# Patient Record
Sex: Female | Born: 1955
Health system: Southern US, Community
[De-identification: ages and names within clinical notes are randomized; demographics above are authoritative.]

## PROBLEM LIST (undated history)

## (undated) DIAGNOSIS — F29 Unspecified psychosis not due to a substance or known physiological condition: Secondary | ICD-10-CM

## (undated) DIAGNOSIS — Z9889 Other specified postprocedural states: Secondary | ICD-10-CM

## (undated) DIAGNOSIS — I10 Essential (primary) hypertension: Secondary | ICD-10-CM

## (undated) DIAGNOSIS — E785 Hyperlipidemia, unspecified: Secondary | ICD-10-CM

## (undated) HISTORY — DX: Other specified postprocedural states: Z98.890

## (undated) HISTORY — DX: Hyperlipidemia, unspecified: E78.5

## (undated) HISTORY — DX: Essential (primary) hypertension: I10

## (undated) HISTORY — DX: Unspecified psychosis not due to a substance or known physiological condition: F29

---

## 1984-02-09 HISTORY — PX: ECTOPIC PREGNANCY SURGERY: SHX613

## 1999-02-09 HISTORY — PX: CHOLECYSTECTOMY: SHX55

## 2000-09-22 ENCOUNTER — Ambulatory Visit (HOSPITAL_COMMUNITY): Admission: RE | Admit: 2000-09-22 | Discharge: 2000-09-22 | Payer: Self-pay | Admitting: Family Medicine

## 2000-09-22 ENCOUNTER — Encounter: Payer: Self-pay | Admitting: Family Medicine

## 2000-09-26 ENCOUNTER — Encounter: Payer: Self-pay | Admitting: Family Medicine

## 2000-09-26 ENCOUNTER — Ambulatory Visit (HOSPITAL_COMMUNITY): Admission: RE | Admit: 2000-09-26 | Discharge: 2000-09-26 | Payer: Self-pay | Admitting: Family Medicine

## 2000-10-03 ENCOUNTER — Ambulatory Visit (HOSPITAL_COMMUNITY): Admission: RE | Admit: 2000-10-03 | Discharge: 2000-10-03 | Payer: Self-pay | Admitting: Family Medicine

## 2000-10-03 ENCOUNTER — Encounter: Payer: Self-pay | Admitting: Family Medicine

## 2000-10-12 ENCOUNTER — Ambulatory Visit (HOSPITAL_COMMUNITY): Admission: RE | Admit: 2000-10-12 | Discharge: 2000-10-12 | Payer: Self-pay | Admitting: General Surgery

## 2000-10-25 ENCOUNTER — Observation Stay (HOSPITAL_COMMUNITY): Admission: RE | Admit: 2000-10-25 | Discharge: 2000-10-26 | Payer: Self-pay | Admitting: General Surgery

## 2001-01-19 ENCOUNTER — Other Ambulatory Visit: Admission: RE | Admit: 2001-01-19 | Discharge: 2001-01-19 | Payer: Self-pay | Admitting: Family Medicine

## 2001-12-25 ENCOUNTER — Encounter: Payer: Self-pay | Admitting: Family Medicine

## 2001-12-25 ENCOUNTER — Ambulatory Visit (HOSPITAL_COMMUNITY): Admission: RE | Admit: 2001-12-25 | Discharge: 2001-12-25 | Payer: Self-pay | Admitting: Family Medicine

## 2004-01-27 ENCOUNTER — Ambulatory Visit: Payer: Self-pay | Admitting: Family Medicine

## 2004-05-28 ENCOUNTER — Ambulatory Visit: Payer: Self-pay | Admitting: Family Medicine

## 2004-06-01 ENCOUNTER — Ambulatory Visit (HOSPITAL_COMMUNITY): Admission: RE | Admit: 2004-06-01 | Discharge: 2004-06-01 | Payer: Self-pay | Admitting: Family Medicine

## 2004-09-14 ENCOUNTER — Ambulatory Visit (HOSPITAL_COMMUNITY): Admission: RE | Admit: 2004-09-14 | Discharge: 2004-09-14 | Payer: Self-pay | Admitting: Ophthalmology

## 2004-10-26 ENCOUNTER — Ambulatory Visit: Payer: Self-pay | Admitting: Family Medicine

## 2004-11-26 ENCOUNTER — Ambulatory Visit (HOSPITAL_COMMUNITY): Admission: RE | Admit: 2004-11-26 | Discharge: 2004-11-26 | Payer: Self-pay | Admitting: General Surgery

## 2005-03-01 ENCOUNTER — Ambulatory Visit: Payer: Self-pay | Admitting: Family Medicine

## 2005-08-04 ENCOUNTER — Ambulatory Visit: Payer: Self-pay | Admitting: Family Medicine

## 2005-08-09 ENCOUNTER — Ambulatory Visit (HOSPITAL_COMMUNITY): Admission: RE | Admit: 2005-08-09 | Discharge: 2005-08-09 | Payer: Self-pay | Admitting: Family Medicine

## 2005-08-17 ENCOUNTER — Ambulatory Visit: Payer: Self-pay | Admitting: Family Medicine

## 2005-09-09 ENCOUNTER — Ambulatory Visit (HOSPITAL_COMMUNITY): Payer: Self-pay | Admitting: Psychiatry

## 2006-01-18 ENCOUNTER — Ambulatory Visit (HOSPITAL_COMMUNITY): Admission: RE | Admit: 2006-01-18 | Discharge: 2006-01-18 | Payer: Self-pay | Admitting: Family Medicine

## 2006-05-06 ENCOUNTER — Ambulatory Visit: Payer: Self-pay | Admitting: Family Medicine

## 2006-10-19 ENCOUNTER — Emergency Department (HOSPITAL_COMMUNITY): Admission: EM | Admit: 2006-10-19 | Discharge: 2006-10-19 | Payer: Self-pay | Admitting: Emergency Medicine

## 2006-11-03 ENCOUNTER — Ambulatory Visit: Payer: Self-pay | Admitting: Family Medicine

## 2007-02-09 ENCOUNTER — Encounter: Payer: Self-pay | Admitting: Family Medicine

## 2007-05-30 ENCOUNTER — Ambulatory Visit: Payer: Self-pay | Admitting: Family Medicine

## 2007-08-31 ENCOUNTER — Ambulatory Visit: Payer: Self-pay | Admitting: Family Medicine

## 2007-09-05 DIAGNOSIS — I10 Essential (primary) hypertension: Secondary | ICD-10-CM

## 2007-09-09 DIAGNOSIS — F29 Unspecified psychosis not due to a substance or known physiological condition: Secondary | ICD-10-CM

## 2007-09-09 HISTORY — DX: Unspecified psychosis not due to a substance or known physiological condition: F29

## 2007-09-15 ENCOUNTER — Emergency Department (HOSPITAL_COMMUNITY): Admission: EM | Admit: 2007-09-15 | Discharge: 2007-09-15 | Payer: Self-pay | Admitting: Emergency Medicine

## 2007-09-16 ENCOUNTER — Inpatient Hospital Stay (HOSPITAL_COMMUNITY): Admission: AD | Admit: 2007-09-16 | Discharge: 2007-09-21 | Payer: Self-pay | Admitting: Psychiatry

## 2007-09-16 ENCOUNTER — Ambulatory Visit: Payer: Self-pay | Admitting: Psychiatry

## 2007-09-21 ENCOUNTER — Encounter: Payer: Self-pay | Admitting: Family Medicine

## 2007-09-25 ENCOUNTER — Ambulatory Visit: Payer: Self-pay | Admitting: Family Medicine

## 2007-09-25 DIAGNOSIS — F29 Unspecified psychosis not due to a substance or known physiological condition: Secondary | ICD-10-CM | POA: Insufficient documentation

## 2007-10-05 ENCOUNTER — Encounter: Payer: Self-pay | Admitting: Family Medicine

## 2007-12-26 ENCOUNTER — Other Ambulatory Visit: Admission: RE | Admit: 2007-12-26 | Discharge: 2007-12-26 | Payer: Self-pay | Admitting: Family Medicine

## 2007-12-26 ENCOUNTER — Encounter: Payer: Self-pay | Admitting: Family Medicine

## 2007-12-26 ENCOUNTER — Ambulatory Visit: Payer: Self-pay | Admitting: Family Medicine

## 2007-12-26 LAB — CONVERTED CEMR LAB: OCCULT 1: NEGATIVE

## 2007-12-27 ENCOUNTER — Telehealth: Payer: Self-pay | Admitting: Family Medicine

## 2008-02-07 ENCOUNTER — Ambulatory Visit: Payer: Self-pay | Admitting: Family Medicine

## 2008-02-12 ENCOUNTER — Encounter: Payer: Self-pay | Admitting: Family Medicine

## 2008-03-20 ENCOUNTER — Ambulatory Visit (HOSPITAL_COMMUNITY): Admission: RE | Admit: 2008-03-20 | Discharge: 2008-03-20 | Payer: Self-pay | Admitting: Gastroenterology

## 2008-03-20 ENCOUNTER — Ambulatory Visit: Payer: Self-pay | Admitting: Gastroenterology

## 2008-03-20 DIAGNOSIS — Z9889 Other specified postprocedural states: Secondary | ICD-10-CM

## 2008-03-20 HISTORY — DX: Other specified postprocedural states: Z98.890

## 2008-05-01 ENCOUNTER — Ambulatory Visit: Payer: Self-pay | Admitting: Family Medicine

## 2008-10-23 ENCOUNTER — Telehealth: Payer: Self-pay | Admitting: Family Medicine

## 2008-11-04 ENCOUNTER — Encounter: Payer: Self-pay | Admitting: Family Medicine

## 2009-01-29 ENCOUNTER — Telehealth: Payer: Self-pay | Admitting: Family Medicine

## 2009-02-14 ENCOUNTER — Encounter (INDEPENDENT_AMBULATORY_CARE_PROVIDER_SITE_OTHER): Payer: Self-pay | Admitting: *Deleted

## 2009-02-20 ENCOUNTER — Telehealth: Payer: Self-pay | Admitting: Family Medicine

## 2009-02-25 ENCOUNTER — Ambulatory Visit: Payer: Self-pay | Admitting: Family Medicine

## 2009-02-25 DIAGNOSIS — E559 Vitamin D deficiency, unspecified: Secondary | ICD-10-CM

## 2009-02-25 DIAGNOSIS — R5381 Other malaise: Secondary | ICD-10-CM

## 2009-02-25 DIAGNOSIS — R5383 Other fatigue: Secondary | ICD-10-CM

## 2009-02-25 DIAGNOSIS — R634 Abnormal weight loss: Secondary | ICD-10-CM

## 2009-02-27 LAB — CONVERTED CEMR LAB
BUN: 13 mg/dL (ref 6–23)
Basophils Relative: 0 % (ref 0–1)
CO2: 25 meq/L (ref 19–32)
Chloride: 105 meq/L (ref 96–112)
Cholesterol: 165 mg/dL (ref 0–200)
Creatinine, Ser: 0.75 mg/dL (ref 0.40–1.20)
Glucose, Bld: 78 mg/dL (ref 70–99)
HDL: 55 mg/dL (ref >39)
Hemoglobin: 11.8 g/dL — ABNORMAL LOW (ref 12.0–15.0)
Lymphocytes Relative: 23 % (ref 12–46)
MCHC: 32.4 g/dL (ref 30.0–36.0)
MCV: 86.3 fL (ref 78.0–100.0)
Neutrophils Relative %: 61 % (ref 43–77)
Platelets: 217 10*3/uL (ref 150–400)
RDW: 15 % (ref 11.5–15.5)
Sodium: 141 meq/L (ref 135–145)
Total CHOL/HDL Ratio: 3
VLDL: 8 mg/dL (ref 0–40)

## 2009-03-03 ENCOUNTER — Encounter: Payer: Self-pay | Admitting: Family Medicine

## 2009-03-03 ENCOUNTER — Ambulatory Visit (HOSPITAL_COMMUNITY): Admission: RE | Admit: 2009-03-03 | Discharge: 2009-03-03 | Payer: Self-pay | Admitting: Family Medicine

## 2009-05-12 ENCOUNTER — Ambulatory Visit: Payer: Self-pay | Admitting: Family Medicine

## 2009-05-12 ENCOUNTER — Other Ambulatory Visit: Admission: RE | Admit: 2009-05-12 | Discharge: 2009-05-12 | Payer: Self-pay | Admitting: Family Medicine

## 2009-05-12 LAB — CONVERTED CEMR LAB: OCCULT 1: NEGATIVE

## 2009-11-16 ENCOUNTER — Emergency Department (HOSPITAL_COMMUNITY)
Admission: EM | Admit: 2009-11-16 | Discharge: 2009-11-16 | Payer: Self-pay | Source: Home / Self Care | Admitting: Emergency Medicine

## 2009-11-19 ENCOUNTER — Ambulatory Visit: Payer: Self-pay | Admitting: Family Medicine

## 2010-02-06 ENCOUNTER — Telehealth (INDEPENDENT_AMBULATORY_CARE_PROVIDER_SITE_OTHER): Payer: Self-pay | Admitting: *Deleted

## 2010-03-01 ENCOUNTER — Encounter: Payer: Self-pay | Admitting: Family Medicine

## 2010-03-10 NOTE — Letter (Signed)
Summary: office  notes  office  notes   Imported By: Lind Guest 06/20/2009 13:05:30  _____________________________________________________________________  External Attachment:    Type:   Image     Comment:   External Document

## 2010-03-10 NOTE — Progress Notes (Signed)
Summary: MEDS  Phone Note Call from Patient   Summary of Call: NEEDS THESE MEDS CALLED IN AT [facility] IN [place]  KLOR-CON M 10 TAB APA    TRIAMT/HCTZ 75-50 MG  HAS APPT. TUESDAY AT 8:00 AM  PLEASE CALL HER WHEN DONE  [phone] Initial call taken by: Lind Guest,  February 20, 2009 10:58 AM  Follow-up for Phone Call        Patient hasn't been here since March 2010. Gave her a 1 month fill in december then she missed her January app. Called and wants another fill because she rescheduled for he 18th. Called pharmacy and she has maxzide there ready to be picked up and she got her last Klor con filled the first of January so she has enough to last to her next ov. called pt, left msg Follow-up by: Everitt Amber,  February 20, 2009 1:01 PM  Additional Follow-up for Phone Call Additional follow up Details #1::        pls let her know sufficient meds at pharmacy ready for collection when she callss back, or pls call and let her know Additional Follow-up by: Syliva Overman MD,  February 20, 2009 10:22 PM    Additional Follow-up for Phone Call Additional follow up Details #2::    i have called 4 x today and the machine will not accept messages Follow-up by: Lind Guest,  February 21, 2009 2:22 PM

## 2010-03-10 NOTE — Assessment & Plan Note (Signed)
Summary: OV   Vital Signs:  Patient profile:   55 year old female Menstrual status:  perimenopausal Height:      67 inches Weight:      123.25 pounds BMI:     19.37 O2 Sat:      98 % on Room air Pulse rate:   77 / minute Pulse rhythm:   regular Resp:     16 per minute BP sitting:   110 / 70  Vitals Entered By: Everitt Amber (February 25, 2009 8:13 AM)  O2 Flow:  Room air CC: Follow up chronic problems Is Patient Diabetic? No   Primary Care Provider:  Syliva Overman MD  CC:  Follow up chronic problems.  History of Present Illness: Reports  that she has been  doing well. Denies recent fever or chills. Denies sinus pressure, nasal congestion , ear pain or sore throat. Denies chest congestion, or cough productive of sputum. Denies chest pain, palpitations, PND, orthopnea or leg swelling. Denies abdominal pain, nausea, vomitting, diarrhea or constipation. Denies change in bowel movements or bloody stool. Denies dysuria , frequency, incontinence or hesitancy. Denies  joint pain, swelling, or reduced mobility. Denies headaches, vertigo, seizures. Denies depression, anxiety or insomnia.She does state thAT SHE OFTEN FEELS LONELY. sHE MISSES HER DAUGHTER AND HUSBAND AND IS NOT DATING. sHE FINDS IT CHALLENGING TO EAT AT TIMES, SINCE SHE IS ALONE AND SHE HAS LOST AN ADDITIONAL APPROX 10 POUNDS SINCE HER LAST VISIT. Denies  rash, lesions, or itch.     Preventive Screening-Counseling & Management  Alcohol-Tobacco     Smoking Status: no  Allergies: 1)  ! Prednisone 2)  ! * Ciprofloxacin  Social History: Smoking Status:  no  Review of Systems      See HPI Eyes:  Denies blurring and discharge. GU:  reports menstrual flow approx 3 months ago, and denies menopause. Neuro:  Denies falling down, headaches, seizures, and tingling. Psych:  Complains of mental problems; denies anxiety, depression, easily angered, easily tearful, irritability, panic attacks, sense of great danger,  suicidal thoughts/plans, thoughts of violence, and unusual visions or sounds. Endo:  Denies cold intolerance, excessive hunger, excessive thirst, excessive urination, heat intolerance, polyuria, and weight change. Heme:  Denies abnormal bruising and bleeding. Allergy:  Denies hives or rash, itching eyes, and seasonal allergies.  Physical Exam  General:  Well-developed,enderweight appearing,in no acute distress; alert,unable to give a reliable history, confused HEENT: No facial asymmetry,  EOMI, No sinus tenderness, TM's Clear, oropharynx  pink and moist.   Chest: Clear to auscultation bilaterally.  CVS: S1, S2, No murmurs, No S3.   Abd: Soft, Nontender.  MS: Adequate ROM spine, hips, shoulders and knees.  Ext: No edema.   CNS: CN 2-12 intact, power tone and sensation normal throughout.   Skin: Intact, no visible lesions or rashes.  Psych: Good eye contact,flat affect.  Memory losst, not anxious or depressed appearing.    Impression & Recommendations:  Problem # 1:  VITAMIN D DEFICIENCY (ICD-268.9) Assessment Comment Only  Orders: T-Vitamin D (25-Hydroxy) ([id number])  Problem # 2:  LOSS OF WEIGHT (ICD-783.21) Assessment: Deteriorated encouraged to schedule regular meals and supplement with ensure  Problem # 3:  HYPERTENSION (ICD-401.9) Assessment: Improved  The following medications were removed from the medication list:    Triamterene-hctz 75-50 Mg Tabs (Triamterene-hctz) ..... One tab by mouth qd  Orders: T-Basic Metabolic Panel [id number])  BP today: 110/70, pt off meds for over 1 week reportedly Prior BP: 108/70 (05/01/2008)  Labs Reviewed:  K+: 3.7 (09/25/2007)  Complete Medication List: 1)  Centrum Liqd (Multiple vitamins-minerals) .... One teaspoon daily 2)  Oscal 500/200 D-3 500-200 Mg-unit Tabs (Calcium-vitamin d) .... Take 1 tablet by mouth three times a day  Other Orders: T-Lipid Profile ([id number]) T-TSH ([id number]) T-CBC w/Diff  [phone]) Radiology Referral (Radiology) Radiology Referral (Radiology)  Patient Instructions: 1)  CPE in 4 weeks. 2)  BMP prior to visit, ICD-9: 3)  Lipid Panel prior to visit, ICD-9:  fasting today 4)  TSH prior to visit, ICD-9: 5)  CBC w/ Diff prior to visit, ICD-9: 6)  Vit D level 7)  NO more blood pressure pills needed, Bp is normal . 8)  You will be referred for a mamo and a bone density scan Prescriptions: OSCAL 500/200 D-3 500-200 MG-UNIT TABS (CALCIUM-VITAMIN D) Take 1 tablet by mouth three times a day  #90 x 11   Entered and Authorized by:   Syliva Overman MD   Signed by:   Syliva Overman MD on 02/25/2009   Method used:   Electronically to        [facility] [address] [facility] (retail)       [address]       [place], [place]  [address]       Ph: [phone]       Fax: [phone]   RxID:   [id number] CENTRUM  LIQD (MULTIPLE VITAMINS-MINERALS) one teaspoon daily  #150cc x 2   Entered and Authorized by:   Syliva Overman MD   Signed by:   Syliva Overman MD on 02/25/2009   Method used:   Electronically to        [facility] [address] [facility] (retail)       [address]       [place], [place]  [address]       Ph: [phone]       Fax: [phone]   RxID:   [id number]       Appended Document: OV Pls refill pts maxzide , at the ov I believed what she told me that she had not taken them for over 1 week,she is an unreliable historian, and I think based on refill noted she has still been taking them, verify [facility] if they have been collected in past 4 months, and let pt know she needs torsume the maxzide pls   IF yOU are CONFUSED pls discuss with me before calling the pt  Appended Document: OV called patient, left message  Appended Document: OV called patient, no answer, mailed letter  Appended Document: OV Prescriptions: VITAMIN D (ERGOCALCIFEROL) 50000 UNIT CAPS (ERGOCALCIFEROL) one cap by mouth every  week  #4 x 2   Entered by:   Worthy Keeler LPN   Authorized by:   Syliva Overman MD   Signed by:   Worthy Keeler LPN on 07/37/1062   Method used:   Electronically to        [facility] [address] [facility] (retail)       [address]       [place], [place]  [address]       Ph: [phone]       Fax: [phone]   RxID:   [id number] MAXZIDE 75-50 MG TABS (TRIAMTERENE-HCTZ) one tab by mouth qd  #30 x 2   Entered by:   Worthy Keeler LPN  Authorized by:   Syliva Overman MD   Signed by:   Worthy Keeler LPN on 16/11/9602   Method used:   Electronically to        [facility] [facility] (retail)       [address]       [place], [place]  [address]       Ph: [phone]       Fax: [phone]   RxID:   [id number]  rx sent, patient aware

## 2010-03-10 NOTE — Letter (Signed)
Summary: labs  labs   Imported By: Lind Guest 06/20/2009 13:04:43  _____________________________________________________________________  External Attachment:    Type:   Image     Comment:   External Document

## 2010-03-10 NOTE — Letter (Signed)
Summary: 1st Missed Appt.  [facility]  [address]   [place], [place] [address]   Phone: [phone]  Fax: [phone]    February 14, 2009  MRN: [id number]  TALAJAH SLIMP [address] [place], [place]  [address]  Dear Ms. Senaida Ores,  At [facility], we make every attempt to fit patients into our schedule by reserving several appointment slots for same-day appointments.  However, we cannot always make appointments for patients the same day they are calling.  At the end of the day, we look back at our schedule and find that because of last-minute cancellations and patients not showing up for their scheduled appointments, we have several appointment slots that are left open and could have been used by another person who really needed it.  In the past, you may have been one of the patients who could not get in when you needed to.  But recently, you were one of the patients with an appointment that you didn't show up for or canceled too late for [place] to fill it.  We choose not to charge no-show or last minute cancellation fees to our patients, like many other offices do.  We do not wish to institute that policy and hope we never have to.  However, we kindly request that you assist [place] by providing at least 24 hours' notice if you can't make your appointment.  If no-shows or late cancellations become habitual (i.e. Three or more in a one-year period), we may terminate the physician-patient relationship.    Thank you for your consideration and cooperation.   Altamease Oiler

## 2010-03-10 NOTE — Assessment & Plan Note (Signed)
Summary: ov   Vital Signs:  Patient profile:   55 year old female Menstrual status:  perimenopausal Height:      67 inches Weight:      122.75 pounds BMI:     19.29 O2 Sat:      94 % on Room air Pulse rate:   72 / minute Pulse rhythm:   regular Resp:     16 per minute BP sitting:   110 / 80  (right arm) Cuff size:   regular  Vitals Entered By: Mauricia Area, CMA  O2 Flow:  Room air CC: follow up   Primary Care Provider:  Syliva Overman MD  CC:  follow up.  History of Present Illness: Pt in for f/u of recent dog bite to the left  hand by her pet. She was initially treated in the ed. States the area is getting better. Initially it was painful, red and draining pus. She denies any current feer or chills and has no other concerns. Reports  that prior to this she had been doing well. Denies recent fever or chills. Denies sinus pressure, nasal congestion , ear pain or sore throat. Denies chest congestion, or cough productive of sputum. Denies chest pain, palpitations, PND, orthopnea or leg swelling. Denies abdominal pain, nausea, vomitting, diarrhea or constipation. Denies change in bowel movements or bloody stool. Denies dysuria , frequency, incontinence or hesitancy. Denies  joint pain, swelling, or reduced mobility. Denies headaches, vertigo, seizures.   Allergies (verified): 1)  ! Prednisone 2)  ! * Ciprofloxacin  Review of Systems      See HPI Eyes:  Denies discharge and red eye. Psych:  Complains of mental problems; denies suicidal thoughts/plans, thoughts of violence, and unusual visions or sounds. Endo:  Denies excessive thirst and excessive urination. Heme:  Denies abnormal bruising and bleeding.  Physical Exam  General:  Well-developed,well-nourished,in no acute distress; alert,appropriate and cooperative throughout examination HEENT: No facial asymmetry,  EOMI, No sinus tenderness, TM's Clear, oropharynx  pink and moist.   Chest: Clear to auscultation  bilaterally.  CVS: S1, S2, No murmurs, No S3.   Abd: Soft, Nontender.  MS: Adequate ROM spine, hips, shoulders and knees.  Ext: No edema.   CNS: CN 2-12 intact, power tone and sensation normal throughout.   Skin: Intact, no evidence of cellulitis of left hand, healing puncture site Psych: Good eye contact, blunted affect.     Impression & Recommendations:  Problem # 1:  DOG BITE (ICD-E906.0) Assessment Improved pt advised to keep are clean and dry, and to complete antibiotic course  Problem # 2:  HYPERTENSION (ICD-401.9) Assessment: Unchanged  Her updated medication list for this problem includes:    Maxzide 75-50 Mg Tabs (Triamterene-hctz) ..... One tab by mouth qd  Orders: T-Basic Metabolic Panel [id number])  BP today: 110/80 Prior BP: 120/70 (05/12/2009)  Labs Reviewed: K+: 3.7 (02/25/2009) Creat: : 0.75 (02/25/2009)   Chol: 165 (02/25/2009)   HDL: 55 (02/25/2009)   LDL: 102 (02/25/2009)   TG: 38 (02/25/2009)  Problem # 3:  UNSPECIFIED PSYCHOSIS (ICD-298.9) Assessment: Improved pt on no meds, however came out of work, disabled because of uncontrolled psychotic symptoms  Complete Medication List: 1)  Centrum Liqd (Multiple vitamins-minerals) .... One teaspoon daily 2)  Oscal 500/200 D-3 500-200 Mg-unit Tabs (Calcium-vitamin d) .... Take 1 tablet by mouth three times a day 3)  Maxzide 75-50 Mg Tabs (Triamterene-hctz) .... One tab by mouth qd 4)  Vitamin D (ergocalciferol) 50000 Unit Caps (Ergocalciferol) .... One  cap by mouth every week 5)  Klor-con 10 10 Meq Cr-tabs (Potassium chloride) .... One tab by mouth once daily 6)  Amoxicillin-pot Clavulanate 875-125 Mg Tabs (Amoxicillin-pot clavulanate) .Marland Kitchen.. 1 tab two times a day  Other Orders: T-Lipid Profile ([id number]) T-CBC w/Diff [id number]) T-Vitamin D (25-Hydroxy) [phone]) Influenza Vaccine MCR ([address]52)  Patient Instructions: 1)  Please schedule a follow-up appointment in 4 months. 2)  flu vaccine  today. 3)  pls finish the antibiotics, and try to get no more bites!!! 4)  BMP prior to visit, ICD-9: 5)  Lipid Panel prior to visit, ICD-9:   fasting labs in 4 mths 6)  CBC w/ Diff prior to visit, ICD-9: 7)  No med changes Prescriptions: KLOR-CON 10 10 MEQ CR-TABS (POTASSIUM CHLORIDE) one tab by mouth once daily  #30 x 4   Entered by:   Adella Hare LPN   Authorized by:   Syliva Overman MD   Signed by:   Adella Hare LPN on 84/13/2440   Method used:   Electronically to        [facility]  [address] (retail)       [address]       [place], [place]  [address]       Ph: [phone]       Fax: [phone]   RxID:   [id number] MAXZIDE 75-50 MG TABS (TRIAMTERENE-HCTZ) one tab by mouth qd  #30 x 4   Entered by:   Adella Hare LPN   Authorized by:   Syliva Overman MD   Signed by:   Adella Hare LPN on 30/16/0109   Method used:   Electronically to        [facility]  [address] (retail)       [address]       [place], [place]  [address]       Ph: [phone]       Fax: [phone]   RxID:   [id number]    Immunizations Administered:  Influenza Vaccine # 1:    Vaccine Type: Fluvax MCR    Site: right deltoid    Mfr: novartis    Dose: 0.5 ml    Route: IM    Given by: Adella Hare LPN    Exp. Date: 06/2010    Lot #: [id number]    VIS given: 09/02/09 version given November 19, 2009.

## 2010-03-10 NOTE — Letter (Signed)
Summary: x rays  x rays   Imported By: Lind Guest 06/20/2009 13:06:03  _____________________________________________________________________  External Attachment:    Type:   Image     Comment:   External Document

## 2010-03-10 NOTE — Letter (Signed)
Summary: MED REVIEW SIGNED PAPER  MED REVIEW SIGNED PAPER   Imported By: Lind Guest 11/21/2009 09:54:27  _____________________________________________________________________  External Attachment:    Type:   Image     Comment:   External Document

## 2010-03-10 NOTE — Letter (Signed)
Summary: phone notes  phone notes   Imported By: Lind Guest 06/20/2009 13:06:39  _____________________________________________________________________  External Attachment:    Type:   Image     Comment:   External Document

## 2010-03-10 NOTE — Letter (Signed)
Summary: consults  consults   Imported By: Lind Guest 06/20/2009 13:04:04  _____________________________________________________________________  External Attachment:    Type:   Image     Comment:   External Document

## 2010-03-10 NOTE — Assessment & Plan Note (Signed)
Summary: physical per Dr. Lodema Hong in 4wks   Vital Signs:  Patient profile:   55 year old female Menstrual status:  perimenopausal Height:      67 inches Weight:      118 pounds BMI:     18.55 O2 Sat:      98 % Pulse rate:   60 / minute Pulse rhythm:   regular Resp:     16 per minute BP sitting:   120 / 70  (left arm) Cuff size:   regular  Vitals Entered By: Everitt Amber LPN (May 13, 5407 2:52 PM)  CC: CPE   Primary Care Provider:  Syliva Overman MD  CC:  CPE.  History of Present Illness: Reports  thatshe is doing fairly well, and has no complaints. unfortunately, the pt continues to lose weight, and reports thar she "eats well" Denies recent fever or chills. Denies sinus pressure, nasal congestion , ear pain or sore throat. Denies chest congestion, or cough productive of sputum. Denies chest pain, palpitations, PND, orthopnea or leg swelling. Denies abdominal pain, nausea, vomitting, diarrhea or constipation. Denies change in bowel movements or bloody stool. Denies dysuria , frequency, incontinence or hesitancy. Denies  joint pain, swelling, or reduced mobility. Denies headaches, vertigo, seizures. Denies depression, anxiety or insomnia.Denies hallucinATIONS Denies  rash, lesions, or itch.     Current Medications (verified): 1)  Centrum  Liqd (Multiple Vitamins-Minerals) .... One Teaspoon Daily 2)  Oscal 500/200 D-3 500-200 Mg-Unit Tabs (Calcium-Vitamin D) .... Take 1 Tablet By Mouth Three Times A Day 3)  Maxzide 75-50 Mg Tabs (Triamterene-Hctz) .... One Tab By Mouth Qd 4)  Vitamin D (Ergocalciferol) 50000 Unit Caps (Ergocalciferol) .... One Cap By Mouth Every Week 5)  Klor-Con 10 10 Meq Cr-Tabs (Potassium Chloride) .... One Tab By Mouth Once Daily  Allergies (verified): 1)  ! Prednisone 2)  ! * Ciprofloxacin  Review of Systems      See HPI General:  Denies fatigue and sleep disorder. Eyes:  Denies blurring and discharge. Endo:  Denies cold intolerance,  excessive hunger, excessive thirst, excessive urination, heat intolerance, and polyuria. Heme:  Denies abnormal bruising, bleeding, enlarge lymph nodes, and fevers. Allergy:  Complains of seasonal allergies; denies hives or rash and itching eyes; MILD.  Physical Exam  General:  alert, well-hydrated, and underweight appearing.   Head:  Normocephalic and atraumatic without obvious abnormalities. No apparent alopecia or balding. Eyes:  No corneal or conjunctival inflammation noted. EOMI. Perrla. Funduscopic exam benign, without hemorrhages, exudates or papilledema. Vision grossly normal. Ears:  External ear exam shows no significant lesions or deformities.  Otoscopic examination reveals clear canals, tympanic membranes are intact bilaterally without bulging, retraction, inflammation or discharge. Hearing is grossly normal bilaterally. Nose:  External nasal examination shows no deformity or inflammation. Nasal mucosa are pink and moist without lesions or exudates. Mouth:  pharynx pink and moist and fair dentition.   Neck:  No deformities, masses, or tenderness noted. Chest Wall:  No deformities, masses, or tenderness noted. Lungs:  Normal respiratory effort, chest expands symmetrically. Lungs are clear to auscultation, no crackles or wheezes. Heart:  Normal rate and regular rhythm. S1 and S2 normal without gallop, murmur, click, rub or other extra sounds. Abdomen:  Bowel sounds positive,abdomen soft and non-tender without masses, organomegaly or hernias noted. Rectal:  No external abnormalities noted. Normal sphincter tone. No rectal masses or tenderness.GUAIC NEGATIVE STOOL Genitalia:  Normal introitus for age, no external lesions, no vaginal discharge, mucosa pink and moist, no  vaginal or cervical lesions, no vaginal atrophy, no friaility or hemorrhage, normal uterus size and position, no adnexal masses or tenderness Msk:  No deformity or scoliosis noted of thoracic or lumbar spine.   Pulses:  R  and L carotid,radial,femoral,dorsalis pedis and posterior tibial pulses are full and equal bilaterally Extremities:  No clubbing, cyanosis, edema, or deformity noted with normal full range of motion of all joints.   Neurologic:  No cranial nerve deficits noted. Station and gait are normal. Plantar reflexes are down-going bilaterally. DTRs are symmetrical throughout. Sensory, motor and coordinative functions appear intact. Skin:  Intact without suspicious lesions or rashes Cervical Nodes:  No lymphadenopathy noted Axillary Nodes:  No palpable lymphadenopathy Inguinal Nodes:  No significant adenopathy Psych:  Oriented X3, good eye contact, depressed affect, and subdued.     Impression & Recommendations:  Problem # 1:  LOSS OF WEIGHT (ICD-783.21) Assessment Deteriorated  PT ADVISED TO MAKE A CONCIOUS EFFORT TO INC WEIGHT EG WITH THE ADDITION OF NUTRTIONSAL SUPPLEMENTS, IF NOT SUCCESSFUL CONSIDERATION WILL BE GIVEN TO AN APETITIE STIMULANT  Problem # 2:  UNSPECIFIED PSYCHOSIS (ICD-298.9) Assessment: Improved Pt showing no obvious signs of psychosissince being layed off, though her affect remains abnormal   Problem # 3:  HYPERTENSION (ICD-401.9) Assessment: Unchanged  Her updated medication list for this problem includes:    Maxzide 75-50 Mg Tabs (Triamterene-hctz) ..... One tab by mouth qd  BP today: 120/70 Prior BP: 110/70 (02/25/2009)  Labs Reviewed: K+: 3.7 (02/25/2009) Creat: : 0.75 (02/25/2009)   Chol: 165 (02/25/2009)   HDL: 55 (02/25/2009)   LDL: 102 (02/25/2009)   TG: 38 (02/25/2009)  Complete Medication List: 1)  Centrum Liqd (Multiple vitamins-minerals) .... One teaspoon daily 2)  Oscal 500/200 D-3 500-200 Mg-unit Tabs (Calcium-vitamin d) .... Take 1 tablet by mouth three times a day 3)  Maxzide 75-50 Mg Tabs (Triamterene-hctz) .... One tab by mouth qd 4)  Vitamin D (ergocalciferol) 50000 Unit Caps (Ergocalciferol) .... One cap by mouth every week 5)  Klor-con 10 10 Meq  Cr-tabs (Potassium chloride) .... One tab by mouth once daily  Other Orders: Pelvic & Breast Exam ( Medicare)  (G0101) Hemoccult Guaiac-1 spec.(in office) ([address])  Patient Instructions: 1)  Please schedule a follow-up appointment in 3 months. 2)  PLS MAKE EVERY ATTEMPT TO INCREASE YOUR INTAKE, DRINK  ENSUREOR BOOST AS SUPPLEMENTS. YOU HAVE LOST AN ADDITIONAL 5 POUNDS AND YOU ARE BECOMING UNDERWEIGHT. iF YOU LOSE MORE WEIGHT YOU WILL NEED TO START MED TO GAIN WEIGHT Prescriptions: KLOR-CON 10 10 MEQ CR-TABS (POTASSIUM CHLORIDE) one tab by mouth once daily  #30 x 3   Entered by:   Everitt Amber LPN   Authorized by:   Syliva Overman MD   Signed by:   Everitt Amber LPN on 60/45/4098   Method used:   Electronically to        [facility]  [address] (retail)       [address]       [place], [place]  [address]       Ph: [phone]       Fax: [phone]   RxID:   [id number] VITAMIN D (ERGOCALCIFEROL) 50000 UNIT CAPS (ERGOCALCIFEROL) one cap by mouth every week  #4 x 3   Entered by:   Everitt Amber LPN   Authorized by:   Syliva Overman MD   Signed by:   Everitt Amber LPN on 53/66/4403   Method used:  Electronically to        [facility] [address] (retail)       [address]       [place], [place]  [address]       Ph: [phone]       Fax: [phone]   RxID:   [id number]   Laboratory Results  Date/Time Received: May 12, 2009  Date/Time Reported: May 12, 2009   Stool - Occult Blood Hemmoccult #1: negative Date: 05/12/2009 Comments: [id number] r 8/11 118 10 12

## 2010-03-10 NOTE — Letter (Signed)
Summary: demo  demo   Imported By: Lind Guest 06/20/2009 13:03:34  _____________________________________________________________________  External Attachment:    Type:   Image     Comment:   External Document

## 2010-03-10 NOTE — Letter (Signed)
Summary: history and phy  history and phy   Imported By: Lind Guest 06/20/2009 13:03:01  _____________________________________________________________________  External Attachment:    Type:   Image     Comment:   External Document

## 2010-03-10 NOTE — Letter (Signed)
Summary: mis  mis   Imported By: Lind Guest 06/20/2009 10:40:03  _____________________________________________________________________  External Attachment:    Type:   Image     Comment:   External Document

## 2010-03-12 NOTE — Progress Notes (Signed)
Summary: please advise  Phone Note Call from Patient   Summary of Call: patients daughter called in and states that her mother's life insurance company has a wavier to them and mailed in for her mom, I told her I thought it was a $29.00 fee for that but I wasn't sure can you please call her at [phone] Alvira Philips is the daughters name.  She wants to get the paperwork in the mail to [place] but she is waiting on you to let her know if it is a charge.  thanks Initial call taken by: Curtis Sites,  February 06, 2010 1:27 PM  Follow-up for Phone Call        spoke with daughter knows that there is a 15.00 charge for a paper to be filled out Follow-up by: Lind Guest,  February 11, 2010 3:23 PM

## 2010-03-23 ENCOUNTER — Ambulatory Visit: Payer: Self-pay | Admitting: Family Medicine

## 2010-03-27 ENCOUNTER — Telehealth: Payer: Self-pay | Admitting: Family Medicine

## 2010-03-31 ENCOUNTER — Encounter: Payer: Self-pay | Admitting: Family Medicine

## 2010-04-02 ENCOUNTER — Encounter: Payer: Self-pay | Admitting: Family Medicine

## 2010-04-07 NOTE — Progress Notes (Signed)
  Phone Note Call from Patient   Summary of Call: Gina Black from [place] called regarding some forms that were supposed to be filled out regarding disability. I told her that I didn't know anything about that and that I would pass the message along to the doctor on Monday Her [phone] Initial call taken by: Everitt Amber LPN,  March 27, 2010 3:23 PM  Follow-up for Phone Call        forms completed, pls scan into special folder also keep paepr copy as difficult to read Follow-up by: Syliva Overman MD,  March 31, 2010 5:33 PM  Additional Follow-up for Phone Call Additional follow up Details #1::        called daughter at [phone] ext [phone] and I told her that I would mail the letters. Additional Follow-up by: Lind Guest,  April 01, 2010 8:27 AM

## 2010-04-07 NOTE — Letter (Signed)
Summary: [employer] [facility] [employer] [facility] [employer]   Imported By: Lind Guest 04/01/2010 08:47:14  _____________________________________________________________________  External Attachment:    Type:   Image     Comment:   External Document

## 2010-04-07 NOTE — Letter (Signed)
Summary: statement of disability papers filled out  statement of disability papers filled out   Imported By: Lind Guest 04/02/2010 10:51:08  _____________________________________________________________________  External Attachment:    Type:   Image     Comment:   External Document

## 2010-04-15 ENCOUNTER — Ambulatory Visit (INDEPENDENT_AMBULATORY_CARE_PROVIDER_SITE_OTHER): Payer: Medicaid Other | Admitting: Family Medicine

## 2010-04-15 ENCOUNTER — Encounter: Payer: Self-pay | Admitting: Family Medicine

## 2010-04-15 ENCOUNTER — Other Ambulatory Visit: Payer: Self-pay | Admitting: Family Medicine

## 2010-04-15 DIAGNOSIS — I10 Essential (primary) hypertension: Secondary | ICD-10-CM

## 2010-04-15 DIAGNOSIS — R634 Abnormal weight loss: Secondary | ICD-10-CM

## 2010-04-15 DIAGNOSIS — F29 Unspecified psychosis not due to a substance or known physiological condition: Secondary | ICD-10-CM

## 2010-04-15 LAB — CONVERTED CEMR LAB
Basophils Absolute: 0 10*3/uL (ref 0.0–0.1)
Basophils Relative: 1 % (ref 0–1)
CO2: 25 meq/L (ref 19–32)
Chloride: 100 meq/L (ref 96–112)
Cholesterol: 186 mg/dL (ref 0–200)
Eosinophils Absolute: 0.2 10*3/uL (ref 0.0–0.7)
Glucose, Bld: 74 mg/dL (ref 70–99)
HCT: 39 % (ref 36.0–46.0)
MCHC: 32.8 g/dL (ref 30.0–36.0)
Monocytes Absolute: 0.2 10*3/uL (ref 0.1–1.0)
Neutro Abs: 2.2 10*3/uL (ref 1.7–7.7)
RBC: 4.74 M/uL (ref 3.87–5.11)
Sodium: 140 meq/L (ref 135–145)
TSH: 1.54 microintl units/mL (ref 0.350–4.500)
Total CHOL/HDL Ratio: 2.8
Triglycerides: 45 mg/dL (ref <150)
VLDL: 9 mg/dL (ref 0–40)
Vit D, 25-Hydroxy: 46 ng/mL (ref 30–89)
WBC: 4.5 10*3/uL (ref 4.0–10.5)

## 2010-04-16 LAB — CBC WITH DIFFERENTIAL/PLATELET
Basophils Absolute: 0 10*3/uL (ref 0.0–0.1)
Eosinophils Absolute: 0.2 10*3/uL (ref 0.0–0.7)
Eosinophils Relative: 4 % (ref 0–5)
HCT: 39 % (ref 36.0–46.0)
MCHC: 32.8 g/dL (ref 30.0–36.0)
Monocytes Absolute: 0.2 10*3/uL (ref 0.1–1.0)
Monocytes Relative: 4 % (ref 3–12)
Neutro Abs: 2.2 10*3/uL (ref 1.7–7.7)
Neutrophils Relative %: 50 % (ref 43–77)
Platelets: 212 10*3/uL (ref 150–400)
RBC: 4.74 MIL/uL (ref 3.87–5.11)

## 2010-04-16 LAB — LIPID PANEL
LDL Cholesterol: 110 mg/dL — ABNORMAL HIGH (ref 0–99)
Triglycerides: 45 mg/dL (ref <150)

## 2010-04-16 LAB — BASIC METABOLIC PANEL
CO2: 25 mEq/L (ref 19–32)
Creat: 0.83 mg/dL (ref 0.40–1.20)

## 2010-04-16 LAB — TSH: TSH: 1.54 u[IU]/mL (ref 0.350–4.500)

## 2010-04-21 ENCOUNTER — Telehealth: Payer: Self-pay | Admitting: Family Medicine

## 2010-04-28 NOTE — Progress Notes (Signed)
Summary: speak to nurse  Phone Note Call from Patient   Summary of Call: pharm has faxed office. and pt is wanting nurse to fax it back to pharm. [phone] asap Initial call taken by: Rudene Anda,  April 21, 2010 3:22 PM    Prescriptions: ZYPREXA 2.5 MG TABS (OLANZAPINE) Take 1 tab by mouth at bedtime  #30 x 0   Entered by:   Adella Hare LPN   Authorized by:   Syliva Overman MD   Signed by:   Adella Hare LPN on 78/29/5621   Method used:   Electronically to        [facility] [phone]* (retail)       [address]       [place], [place]  [address]       Ph: [phone]       Fax: [phone]   RxID:   [id number]

## 2010-04-28 NOTE — Assessment & Plan Note (Signed)
Summary: follow up   Vital Signs:  Patient profile:   55 year old female Menstrual status:  perimenopausal Height:      67 inches Weight:      115 pounds BMI:     18.08 O2 Sat:      98 % Pulse rate:   87 / minute Pulse rhythm:   regular Resp:     16 per minute BP sitting:   104 / 72  (left arm) Cuff size:   regular  Vitals Entered By: Everitt Amber LPN (April 15, 5619 2:16 PM) CC: Follow up chronic problems, daughter has noticed some swelling in her ankles for about a month   Primary Care Provider:  Syliva Overman MD  CC:  Follow up chronic problems and daughter has noticed some swelling in her ankles for about a month.  History of Present Illness: Pt is in with her daughter today. ms. [place] states she is well and has no problems. she unfortunately continues to lose weight , and eats very little even though her daughte is mking every effort to assist her with this. the pt has mental illness, refses treatment, is not a current threat to herself or anyone else, and she lives alone. her only interest is in living with her only daughter who categorically states this is not possible due hermother's health and the fact that she herself needs a life.  Denies recent fever or chills. Denies sinus pressure, nasal congestion , ear pain or sore throat. Denies chest congestion, or cough productive of sputum. Denies chest pain, palpitations, PND, orthopnea or leg swelling. Denies abdominal pain, nausea, vomitting, diarrhea or constipation. Denies change in bowel movements or bloody stool. Denies dysuria , frequency, incontinence or hesitancy. Denies  joint pain, swelling, or reduced mobility. Denies headaches, vertigo, seizures. Denies depression, anxiety or insomnia. Denies  rash, lesions, or itch.     Current Medications (verified): 1)  Oscal 500/200 D-3 500-200 Mg-Unit Tabs (Calcium-Vitamin D) .... Take 1 Tablet By Mouth Three Times A Day 2)  Maxzide 75-50 Mg Tabs  (Triamterene-Hctz) .... One Tab By Mouth Qd 3)  Vitamin D (Ergocalciferol) 50000 Unit Caps (Ergocalciferol) .... One Cap By Mouth Every Week 4)  Klor-Con 10 10 Meq Cr-Tabs (Potassium Chloride) .... One Tab By Mouth Once Daily  Allergies (verified): 1)  ! Prednisone 2)  ! * Ciprofloxacin  Review of Systems      See HPI General:  Complains of fatigue, loss of appetite, and weight loss. Eyes:  Denies discharge, eye pain, and red eye. Psych:  Complains of mental problems; denies anxiety, depression, suicidal thoughts/plans, and thoughts of violence. Endo:  Denies cold intolerance, excessive hunger, excessive thirst, and excessive urination. Heme:  Denies abnormal bruising and bleeding. Allergy:  Denies hives or rash and itching eyes.  Physical Exam  General:  undernourished chronicaslly ill appearing, flat affect, female , in no c/p distress. HEENT: No facial asymmetry,  EOMI, No sinus tenderness,, oropharynx  pink and moist. Bitemporal wasting  Chest: Clear to auscultation bilaterally.  CVS: S1, S2, No murmurs, No S3.   Abd: Soft, Nontender.  MS: Adequate ROM spine, hips, shoulders and knees.  Ext: No edema.   CNS: CN 2-12 intact, power tone and sensation normal throughout.   Skin: Intact, no evidence of cellulitis of left hand, healing puncture site Psych: Good eye contact, blunted affect.  Pt has no insight into the fact that she is physically deteriorating due to inadequate food intake and she is refusing  placement at this time , competence issues may need to be addressed   Impression & Recommendations:  Problem # 1:  LOSS OF WEIGHT (ICD-783.21) Assessment Deteriorated pt challenged to gain weight prior to her return , if not adult protective services will be called in, her daughter is in full agreement and is extremely concerned. States mothe's living conditions are unsatisfactory, has a dog andis incapable of caring for it, will consider sooner intervention.labs are being  checked  Problem # 2:  HYPERTENSION (ICD-401.9) Assessment: Unchanged  Her updated medication list for this problem includes:    Maxzide 75-50 Mg Tabs (Triamterene-hctz) ..... One tab by mouth qd  Orders: T-Basic Metabolic Panel [id number])  BP today: 104/72 Prior BP: 110/80 (11/19/2009)  Labs Reviewed: K+: 3.7 (02/25/2009) Creat: : 0.75 (02/25/2009)   Chol: 165 (02/25/2009)   HDL: 55 (02/25/2009)   LDL: 102 (02/25/2009)   TG: 38 (02/25/2009)  Problem # 3:  UNSPECIFIED PSYCHOSIS (ICD-298.9) Assessment: Comment Only pt has a history of involuntary commitment by her daughter several years ago, the exact dx and med she was on is unknown at this time, she has not consistetly taken any psych meds over the  past 10 years  she is not a threat to herself or society at this time zyprexa is prescribed  Complete Medication List: 1)  Oscal 500/200 D-3 500-200 Mg-unit Tabs (Calcium-vitamin d) .... Take 1 tablet by mouth three times a day 2)  Maxzide 75-50 Mg Tabs (Triamterene-hctz) .... One tab by mouth qd 3)  Vitamin D (ergocalciferol) 50000 Unit Caps (Ergocalciferol) .... One cap by mouth every week 4)  Klor-con 10 10 Meq Cr-tabs (Potassium chloride) .... One tab by mouth once daily 5)  Zyprexa 2.5 Mg Tabs (Olanzapine) .... Take 1 tab by mouth at bedtime  Other Orders: T-Lipid Profile [phone]) T-CBC w/Diff [id number]) T-TSH [phone]) T-Vitamin D (25-Hydroxy) [phone])  Patient Instructions: 1)  f/u in 5 weeks. 2)  BMP prior to visit, ICD-9: 3)  Lipid Panel prior to visit, ICD-9: 4)  TSH prior to visit, ICD-9:   today 5)  CBC w/ Diff prior to visit, ICD-9: 6)  vitamin d 7)  you will be prescribed medication for apetite and your nerves. 8)  It is vital that you eat more, gain weight and take better care of yourself if you are to live on your own Prescriptions: ZYPREXA 2.5 MG TABS (OLANZAPINE) Take 1 tab by mouth at bedtime  #30 x 2   Entered and Authorized by:    Syliva Overman MD   Signed by:   Syliva Overman MD on 04/15/2010   Method used:   Electronically to        [facility] (retail)       [address]       [place], [place]  [address]       Ph: [phone]       Fax: [phone]   RxID:   [id number]    Orders Added: 1)  Est. Patient Level IV [20254] 2)  T-Basic Metabolic Panel [[id number]] 3)  T-Lipid Profile [[id number]] 4)  T-CBC w/Diff [[id number]] 5)  T-TSH [[id number]] 6)  T-Vitamin D (25-Hydroxy) [[id number]]

## 2010-05-12 ENCOUNTER — Encounter: Payer: Self-pay | Admitting: Family Medicine

## 2010-05-13 ENCOUNTER — Encounter: Payer: Self-pay | Admitting: Family Medicine

## 2010-05-14 ENCOUNTER — Ambulatory Visit (INDEPENDENT_AMBULATORY_CARE_PROVIDER_SITE_OTHER): Payer: Medicare Other | Admitting: Family Medicine

## 2010-05-14 ENCOUNTER — Encounter: Payer: Self-pay | Admitting: Family Medicine

## 2010-05-14 VITALS — BP 107/70 | HR 86 | Resp 16 | Ht 67.5 in | Wt 112.1 lb

## 2010-05-14 DIAGNOSIS — F29 Unspecified psychosis not due to a substance or known physiological condition: Secondary | ICD-10-CM

## 2010-05-14 DIAGNOSIS — R636 Underweight: Secondary | ICD-10-CM

## 2010-05-14 DIAGNOSIS — R6251 Failure to thrive (child): Secondary | ICD-10-CM

## 2010-05-14 DIAGNOSIS — I10 Essential (primary) hypertension: Secondary | ICD-10-CM

## 2010-05-14 MED ORDER — MEGESTROL ACETATE 20 MG PO TABS: 20.0000 mg | ORAL_TABLET | Freq: Every day | ORAL | Status: AC

## 2010-05-14 NOTE — Patient Instructions (Addendum)
You have lost weight  You are underweight.  I will contact social services to evaluate your living situation as well as to a gI specialist.  You wil be started on megace also  F/u in 8 weeks

## 2010-05-15 ENCOUNTER — Encounter: Payer: Self-pay | Admitting: Family Medicine

## 2010-05-15 DIAGNOSIS — R6251 Failure to thrive (child): Secondary | ICD-10-CM | POA: Insufficient documentation

## 2010-05-15 DIAGNOSIS — F29 Unspecified psychosis not due to a substance or known physiological condition: Secondary | ICD-10-CM | POA: Insufficient documentation

## 2010-05-15 NOTE — Progress Notes (Signed)
Subjective:    Patient ID: Gina Black, female    DOB: 02-28-1955, 55 y.o.   MRN: [id number]  HPI Patient in with her daughter and a friend of her daughter's for follow up of continued failure to thrive , with weight loss, pt is undernourished. The patient herself continues to state that she has no problem and that she is fine. She states at times that she is able to prepare her food, and at other times that she hopes that social services will be able to deliver meals on wheels to her. She does have a h/o mental health illness, with no specific label, to my knowledge, with hospitalization for this in the past. Her daughter reports her mother as hallucinating, talking about people who do not exist,being extremely manipulative, resenting her ever since she had her comited several years ago, and being extremely manipulative. Her mother expresses the desire to live even short term with her daughter, but this is not an option, and , per her daughter's report, even when she does take food fo 1rher mother she barely eats, reports that there is food in the home. Several weeks ago when Gina Black came in she was prescribed zyprexa, which her daughter believes that she is taking, based on pill count, however , she does not have the medication at this visit , and she has lost more weight.She was specifically advised that social services would be consulted  If she lost more weight , and that temporary if not permanent placement in a family home was her best option. She resists both options, stating give her more time, and that she will contact legal aid services.  She mentions a dog that she feels unable to leave, should she live elsewhere, her daughter reports that she is unable to adequately care for the dog , and at times the dog is not taken out for toileting. She inappropriately ends the visit by inviting both myself, and her daughter's friend to go out to eat with her  At a [employer],  stating her daughter is unwilling to commiing to doing this with her.  Review of Systems    Denies recent fever or chills. Denies sinus pressure, nasal congestion, ear pain or sore throat. Denies chest congestion, productive cough or wheezing. Denies chest pains, palpitations, and leg swelling Denies abdominal pain, nausea, vomiting,diarrhea or constipation.   Denies dysuria, frequency, hesitancy or incontinence. Denies joint pain, swelling and limitation and mobility. Denies headaches, seizure, numbness, or tingling. Denies depression, anxiety or insomnia. Denies skin break down or rash.     Objective:   Physical Exam    Patient alert and in no Cardiopulmonary distress.Undernourished.  HEENT: No facial asymmetry, EOMI, no sinus tenderness,  Oropharynx  moist.  Neck supple no adenopathy.  Chest: Clear to auscultation bilaterally.  CVS: S1, S2 no murmurs, no S3.  ABD: Soft non tender. Bowel sounds normal.  Ext: No edema  MS: Adequate ROM spine, shoulders, hips and knees.  Skin: Intact, no ulcerations or rash noted.  Psych: Good eye contact,flat affect.  Depressed appearing.  CNS: CN 2-12 intact, power, tone and sensation normal throughout.  Recent lab data, which was comprehensive was all normal, this was reviewed.  Assessment & Plan:  Malnutrition with failure to thrive, I believe with no insight , and a h/o mental health illness. The pt denies suicidal or homicidal ideation, and there is no expressed history of this from her daughter. Social services has been contacted for evaluation and assistance  Further contact will be made for adult protective services to investigate . Megace has been added , and a GI referral is put in .  Hypertension : controlled, no med change.  Psychotic d/o NOS: pt to take prescribed medication, I am not at all convinced that she is indeed doing this

## 2010-05-20 ENCOUNTER — Telehealth: Payer: Self-pay | Admitting: Family Medicine

## 2010-05-28 NOTE — Telephone Encounter (Deleted)
error 

## 2010-05-29 ENCOUNTER — Encounter: Payer: Self-pay | Admitting: Urgent Care

## 2010-05-29 ENCOUNTER — Ambulatory Visit (INDEPENDENT_AMBULATORY_CARE_PROVIDER_SITE_OTHER): Payer: Medicare Other | Admitting: Urgent Care

## 2010-05-29 VITALS — BP 121/74 | HR 65 | Temp 97.8°F | Ht 67.0 in | Wt 117.2 lb

## 2010-05-29 DIAGNOSIS — R634 Abnormal weight loss: Secondary | ICD-10-CM

## 2010-05-29 DIAGNOSIS — R4182 Altered mental status, unspecified: Secondary | ICD-10-CM

## 2010-05-29 NOTE — Assessment & Plan Note (Signed)
See abnormal weight loss

## 2010-05-29 NOTE — Progress Notes (Signed)
Referring Provider: Syliva Overman, MD Primary Care Physician:  Syliva Overman, MD, MD Primary Gastroenterologist:  Dr. Darrick Penna  Chief Complaint  Patient presents with  . Failure To Thrive    HPI:  Gina Black is a 55 y.o. female here as a referral from Dr. Lodema Hong for evaluation of unintentional weight loss. She adequately by her daughter who reports today that she has lost about 45 pounds in the past 4 months. She tells me she does grocery shopping for her mother although she lives in [place]. The patient denies any abdominal pain, nausea, vomiting or diarrhea. She tells me she is to be small meals per day most days. She lives alone and cannot drive. She denies any problems with rectal bleeding or melena. At she did have a colonoscopy by Dr. Darrick Penna back in 2010. She has history of mental status changes. Her daughter says these have become worse since her spouse died approximately 3 years ago. She refuses to have treatment for her psychosis her daughter's report. She can be quite confused at times. Her daughter does not believe that she is eating throughout the day.  Lab work from 04/15/2010 shows a normal CBC, TSH, CMP, and vitamin D.  Past Medical History  Diagnosis Date  . Psychotic disorder August 2009    Hopitalised Involuntary commited by daughter   . Hypertension   . Hyperlipidemia   . S/P colonoscopy 03/20/2008    Dr Darrick Penna normal, except hemorrhoids  . Hemorrhoids     Past Surgical History  Procedure Date  . Cholecystectomy 2001  . Ectopic pregnancy surgery 1986    right     Current Outpatient Prescriptions  Medication Sig Dispense Refill  . calcium-vitamin D (OSCAL 500/200 D-3) 500 MG tablet Take 1 tablet by mouth 2 (two) times daily.        . megestrol (MEGACE) 20 MG tablet Take 1 tablet (20 mg total) by mouth daily.  30 tablet  1  . Multiple Vitamins-Minerals (MULTIVITAMIN WITH IRON-MINERALS) liquid Take by mouth daily.        Marland Kitchen OLANZapine  (ZYPREXA) 2.5 MG tablet Take 2.5 mg by mouth at bedtime.        . potassium chloride SA (KLOR-CON M10) 10 MEQ tablet Take 10 mEq by mouth 2 (two) times daily.        Marland Kitchen triamterene-hydrochlorothiazide (MAXZIDE) 75-50 MG per tablet Take 1 tablet by mouth daily. One tablet by mouth qd        . Vitamin D, Ergocalciferol, (DRISDOL) 50000 UNIT CAPS Take 50,000 Units by mouth. Take one tablet by mouth every week         Allergies as of 05/29/2010 - Review Complete 05/29/2010  Allergen Reaction Noted  . Ciprofloxacin  09/05/2007  . Prednisone  09/05/2007    Family History:There is no known family history of colorectal carcinoma , liver disease, or inflammatory bowel disease.   Problem Relation Age of Onset  . Hypertension Mother   . Diabetes Mother   . Kidney disease Mother   . Diabetes Father     History   Social History  . Marital Status: Widowed    Spouse Name: N/A    Number of Children: 1  . Years of Education: N/A   Occupational History  . disabled     previously cook   Social History Main Topics  . Smoking status: Former Smoker -- 1.0 packs/day for 6 years    Types: Cigarettes    Quit date: 04/28/2010  . Smokeless tobacco:  Never Used  . Alcohol Use: No  . Drug Use: No  . Sexually Active: No   Other Topics Concern  . Not on file   Social History Narrative   Lives alone.  Daughter in [place].    Review of Systems: Gen: Denies any fever, chills, sweats, anorexia, fatigue, weakness, malaise, and sleep disorder CV: Denies chest pain, angina, palpitations, syncope, orthopnea, PND, peripheral edema, and claudication. Resp: Denies dyspnea at rest, dyspnea with exercise, cough, sputum, wheezing, coughing up blood, and pleurisy. GI: Denies vomiting blood, jaundice, and fecal incontinence.   Denies dysphagia or odynophagia. GU : Denies urinary burning, blood in urine, urinary frequency, urinary hesitancy, nocturnal urination, and urinary incontinence. MS: Denies joint  pain, limitation of movement, and swelling, stiffness, low back pain, extremity pain. Denies muscle weakness, cramps, atrophy.  Derm: Denies rash, itching, dry skin, hives, moles, warts, or unhealing ulcers.  Psych: Denies depression, anxiety, memory loss, suicidal ideation, hallucinations, paranoia, and confusion. Heme: Denies bruising, bleeding, and enlarged lymph nodes.  Physical Exam: BP 121/74  Pulse 65  Temp 97.8 F (36.6 C)  Ht 5\' 7"  (1.702 m)  Wt 117 lb 3.2 oz (53.162 kg)  BMI 18.36 kg/m2 General:   Alert,  Well-developed, cachectic, pleasant and cooperative in NAD Head:  Normocephalic and atraumatic. Eyes:  Sclera clear, no icterus.   Conjunctiva pink. Ears:  Normal auditory acuity. Nose:  No deformity, discharge,  or lesions. Mouth:  No deformity or lesions, dentition normal. Neck:  Supple; no masses or thyromegaly. Lungs:  Clear throughout to auscultation.   No wheezes, crackles, or rhonchi. No acute distress. Heart:  Regular rate and rhythm; no murmurs, clicks, rubs,  or gallops. Abdomen:  Flat Soft, nontender and nondistended. No masses, hepatosplenomegaly or hernias noted. Normal bowel sounds, without guarding, and without rebound.   Msk:  Symmetrical without gross deformities. Normal posture. Pulses:  Normal pulses noted. Extremities:  Without clubbing or edema. Neurologic:  Alert and  oriented x3. Skin:  Intact without significant lesions or rashes. Cervical Nodes:  No significant cervical adenopathy. Psych:  Alert and cooperative. Normal mood and affect. Confused at times.

## 2010-05-29 NOTE — Assessment & Plan Note (Addendum)
Gina Black is a 55 year old black female who has had a 45 pound weight loss in the past 4 months. Her daughter also notes a steady decline in her cognitive function over the past 3 years since her spouse died. She has underlying history of psychotic disorder/mental illness which I believe may be complicating the picture. She may not be consuming adequate calories to maintain her weight. We do need to rule out occult malignancy in this lady. It is reassuring that she had a colonoscopy 2 years ago which was normal. I have offered EGD for further evaluation to rule out peptic ulcer disease or occult gastric malignancy, however patient declined at this time. She is willing to pursue evaluation with a CT scan of her head, abdomen, pelvis, and we will obtain a chest x-ray to look for occult malignancy. Pending CT results, we may revisit upper endoscopy to complete her GI workup if she is in agreement.

## 2010-05-29 NOTE — Patient Instructions (Signed)
High Protein/High Calorie Diet A high protein/high calorie diet increases the amount of protein and calories in the foods you eat. You may need more protein and calories in your diet because of illness, surgery, injury, weight loss, or have a poor appetite. Eating protein and calorie rich foods can help you gain weight, heal and recover after illness.  SERVING SIZES: Measuring foods and serving sizes helps to make sure you are getting the right amount of food. The list below tells how big or small some common serving sizes are.   1 ounce (oz) of cheese or 1  oz cheese 3 or 4 stacked dice   2-3 oz cooked meat Deck of cards   1 teaspoon (tsp) Tip of little finger   1 tablespoon (Tbsp) Tip of thumb   2 Tbsp Golf ball    Cup Half of a fist   1 Cup A fist  HIGH PROTEIN FOODS: Dairy Sources  Whole milk.  Whole milk yogurt.   Powdered milk.   Cheese.  [employer].   Instant breakfast products.  Eggnog.   Tips for adding to/using in diet:  Use whole milk when making hot cereal, puddings, soups and hot cocoa.   Add powdered milk to baked goods and smoothies or milkshakes.   Make whole milk yogurt parfaits by adding granola, fruit or nuts.   Add cheese to sandwiches, pastas, soups and casseroles.   Add fruit to cottage cheese.  Meat Sources  Beef, pork and poultry.  Fish and seafood.   Peanut butter.   Dried beans.  Eggs.   Tips for adding to/using in diet:  Make meat and cheese omelets   Add eggs to salads and baked goods   Add fish and seafood to salads   Add meat and poultry to casseroles, salads, and soups   Use peanut butter as a topping for pretzels, celery, crackers or add to baked goods   Use beans in casseroles and dips or spreads  GENERAL GUIDELINES TO INCREASE CALORIES:  Replace calorie free drinks with calorie containing drinks such as milk, fruit juices, regular soda, milkshakes, and hot chocolate.   Try to eat 6 small meals instead of 3  large meals each day.   Keep snacks handy such as nuts, trail mixes, dried fruit, and yogurt.   Choose foods with sauces and gravies to increase calories.   Add dried fruits, honey, and half and half to hot or cold cereal.   Add extra fats when possible such as butter, sour cream, cream cheese, and salad dressings   Sprinkle or add cheese to foods often.   Consider adding a clear liquid nutritional supplement to your diet. Your caregiver can give you recommendations.  HIGH CALORIE FOODS: Grain/Starch Sources  Baked goods such as muffins and quick breads.  Croissants.   Pancakes and waffles.  Vegetable Sources  Sauted vegetables in oil.   Fried vegetables.   Salad greens with regular salad dressing or vinegar and oil.  Fruit Sources  Dried fruit.  Canned fruit in syrup.  Fruit juice.  Fat Sources  Avocado.  Butter or margarine.   Whipped cream.   Mayonnaise.  Salad dressing.   Peanuts and mixed nuts.  Cream cheese and sour cream.   Sweets and Dessert Sources  Cake.  Cookies.   Pie.   Ice cream.   Donuts and pastries.   Protein and meal replacement bars.  Jam, preserves and jelly.   Candy bars.   Chocolate.   Chocolate,  caramel, or various syrups.   Document Released: 01/25/2005 Document Re-Released: 11/21/2008 [facility] Patient Information 2011 [place], [place].

## 2010-06-01 NOTE — Telephone Encounter (Deleted)
error 

## 2010-06-01 NOTE — Progress Notes (Signed)
Cc to PCP 

## 2010-06-02 NOTE — Telephone Encounter (Deleted)
error 

## 2010-06-03 ENCOUNTER — Ambulatory Visit (HOSPITAL_COMMUNITY)
Admission: RE | Admit: 2010-06-03 | Discharge: 2010-06-03 | Disposition: A | Discharge status: Home / Self Care | Payer: Medicare Other | Source: Ambulatory Visit | Attending: Urgent Care | Admitting: Urgent Care

## 2010-06-03 DIAGNOSIS — R634 Abnormal weight loss: Secondary | ICD-10-CM | POA: Insufficient documentation

## 2010-06-03 DIAGNOSIS — F29 Unspecified psychosis not due to a substance or known physiological condition: Secondary | ICD-10-CM | POA: Insufficient documentation

## 2010-06-03 DIAGNOSIS — D259 Leiomyoma of uterus, unspecified: Secondary | ICD-10-CM | POA: Insufficient documentation

## 2010-06-03 DIAGNOSIS — R4182 Altered mental status, unspecified: Secondary | ICD-10-CM

## 2010-06-03 DIAGNOSIS — R413 Other amnesia: Secondary | ICD-10-CM | POA: Insufficient documentation

## 2010-06-03 DIAGNOSIS — R935 Abnormal findings on diagnostic imaging of other abdominal regions, including retroperitoneum: Secondary | ICD-10-CM | POA: Insufficient documentation

## 2010-06-03 DIAGNOSIS — N200 Calculus of kidney: Secondary | ICD-10-CM | POA: Insufficient documentation

## 2010-06-03 LAB — CREATININE, SERUM
GFR calc Af Amer: >60 mL/min (ref >60)
GFR calc non Af Amer: >60 mL/min (ref >60)

## 2010-06-03 MED ORDER — IOHEXOL 300 MG/ML  SOLN
100.0000 mL | Freq: Once | INTRAMUSCULAR | Status: AC | PRN
  Administered 2010-06-03: 100 mL via INTRAVENOUS

## 2010-06-08 NOTE — Telephone Encounter (Deleted)
Error can not use rx refill pool

## 2010-06-10 ENCOUNTER — Encounter (HOSPITAL_COMMUNITY): Payer: Medicare Other

## 2010-06-10 ENCOUNTER — Other Ambulatory Visit: Payer: Self-pay | Admitting: Gastroenterology

## 2010-06-10 LAB — BASIC METABOLIC PANEL
BUN: 17 mg/dL (ref 6–23)
CO2: 27 mEq/L (ref 19–32)
Chloride: 106 mEq/L (ref 96–112)
Creatinine, Ser: 0.76 mg/dL (ref 0.4–1.2)
GFR calc Af Amer: >60 mL/min (ref >60)
GFR calc non Af Amer: >60 mL/min (ref >60)
Glucose, Bld: 81 mg/dL (ref 70–99)
Potassium: 3.7 mEq/L (ref 3.5–5.1)
Sodium: 141 mEq/L (ref 135–145)

## 2010-06-11 NOTE — Telephone Encounter (Deleted)
error 

## 2010-06-12 ENCOUNTER — Encounter: Payer: Medicare Other | Admitting: Gastroenterology

## 2010-06-12 ENCOUNTER — Other Ambulatory Visit: Payer: Self-pay | Admitting: Gastroenterology

## 2010-06-12 ENCOUNTER — Ambulatory Visit (HOSPITAL_COMMUNITY)
Admission: RE | Admit: 2010-06-12 | Discharge: 2010-06-12 | Disposition: A | Discharge status: Home / Self Care | Payer: Medicare Other | Source: Ambulatory Visit | Attending: Gastroenterology | Admitting: Gastroenterology

## 2010-06-12 DIAGNOSIS — R634 Abnormal weight loss: Secondary | ICD-10-CM

## 2010-06-12 DIAGNOSIS — I10 Essential (primary) hypertension: Secondary | ICD-10-CM | POA: Insufficient documentation

## 2010-06-12 DIAGNOSIS — K222 Esophageal obstruction: Secondary | ICD-10-CM | POA: Insufficient documentation

## 2010-06-12 DIAGNOSIS — A048 Other specified bacterial intestinal infections: Secondary | ICD-10-CM | POA: Insufficient documentation

## 2010-06-12 DIAGNOSIS — Z01810 Encounter for preprocedural cardiovascular examination: Secondary | ICD-10-CM | POA: Insufficient documentation

## 2010-06-12 DIAGNOSIS — K2940 Chronic atrophic gastritis without bleeding: Secondary | ICD-10-CM

## 2010-06-15 ENCOUNTER — Telehealth: Payer: Self-pay

## 2010-06-15 NOTE — Telephone Encounter (Signed)
Pt's daughter, Alvira Philips, said she was calling for f/u status for pt following her colonoscopy. She can be reached at [phone].

## 2010-06-17 NOTE — Telephone Encounter (Signed)
Please see note attached to path results.
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
## 2010-06-23 NOTE — Op Note (Signed)
NAME:  Gina Black, Gina Black   ACCOUNT NO.:  [id number]   MEDICAL RECORD NO.:  [id number]          PATIENT TYPE:  AMB   LOCATION:  DAY                           [facility]ILITY:  [facility]   PHYSICIAN:  Kassie Mends, M.D.      DATE OF BIRTH:  09/30/1955   DATE OF PROCEDURE:  DATE OF DISCHARGE:                               OPERATIVE REPORT   REFERRING PHYSICIAN:  Syliva Overman.   PROCEDURE:  Colonoscopy.   INDICATION FOR EXAM:  Ms. Lesesne presents for average risk  colon  cancer screening.   FINDINGS:  1. Tortuous colon requiring the patient to be in the supine position      in order to successfully intubate the cecum.  Otherwise no polyps,      masses, inflammatory changes, diverticular, or AVM seen.  2. Small internal hemorrhoids.  Otherwise normal retroflex view of the      rectum.   DIAGNOSIS:  Small hemorrhoids.   RECOMMENDATIONS:  1. Screening colonoscopy in 10 years.  2. She should follow high-fiber diet.  She is given a handout on high-      fiber diet and hemorrhoids.   MEDICATIONS:  1. Demerol 75 mg IV.  2. Versed 4 mg IV.   PROCEDURE TECHNIQUE:  Physical exam was performed.  Informed consent was  obtained from the patient explaining the benefits, risks, and  alternatives to the procedure.  The patient connected to monitor and  placed in left lateral position.  Continuous oxygen was provided by  nasal cannula.  IV medicine administered through an indwelling cannula.  After administration of sedation and rectal exam, the patient's rectum  was intubated.  The scope was advanced under direct visualization to the  cecum.  The scope removed slowly by careful exam of the color, texture,  anatomy, and integrity mucosa on the way out.  The patient was recovered  in endoscopy and discharged home in satisfactory condition.      Kassie Mends, M.D.  Electronically Signed    SM/MEDQ  D:  03/20/2008  T:  03/20/2008  Job:  [id number]
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
## 2010-07-07 NOTE — Op Note (Signed)
NAME:  Gina Black, URESTE NO.:  [id number]  MEDICAL RECORD NO.:  [id number]           PATIENT TYPE:  O  LOCATION:  DAYP                          [facility]ACILITY:  [facility]  PHYSICIAN:  Jonette Eva, M.D.     DATE OF BIRTH:  February 15, 1955  DATE OF PROCEDURE:  06/12/2010 DATE OF DISCHARGE:                              OPERATIVE REPORT   REFERRING PROVIDER:  Milus Mallick. Lodema Hong, MD  PROCEDURE:  Esophagogastroduodenoscopy with cold forceps biopsy of gastric mucosa and esophageal dilation (16-mm).  INDICATION FOR EXAM:  Gina Black is a 55 year old female with underlying psychotic depression.  She lost weight over the last 3 years after the death of her husband.  She has had a normal CBC, TSH, and complete metabolic panel.  Her last colonoscopy was in 2010 and was normal except for hemorrhoids.  She presents for an upper endoscopy to complete the workup for her weight loss.  FINDINGS: 1. Distal esophageal stricture which narrowed the lumen to     approximately 13-mm.  Otherwise no evidence of Barrett's mass,     erosion, ulceration.  The esophagus was dilated to 16 mm. 2. Patchy erythema in the body and the antrum.  Biopsies obtained via     cold forceps to evaluate for H. pylori gastritis. 3. Normal duodenal bulb in second portion of the duodenum.  DIAGNOSES: 1. Distal peptic stricture most likely related to uncontrolled     gastroesophageal reflux disease.  It can cause decreased p.o.     intake. 2. Mild gastritis.  RECOMMENDATIONS: 1. She should start Prilosec and take 20 mg every morning 30 minutes     prior to meals. 2. Follow in 3 months regarding her weight loss. 3. Will call her with results of her biopsies. 4. She is given a handout on gastritis and reflux. 5. She may resume her previous diet.  RADIOGRAPHIC STUDIES:  CT of the abdomen and pelvis with IV contrast in April 2012 showed a large amount of stool in the colon, fibroid uterus, no acute  abnormalities.  MEDICATIONS:  Propofol provided by anesthesia.  PROCEDURE TECHNIQUE:  Physical exam was performed.  Informed consent was obtained from the patient explaining the benefits, risks, and alternatives to the procedure.  The patient connected to monitor and placed in left lateral position.  Continuous oxygen was provided by nasal cannula and IV medicine administered through an indwelling cannula.  After administration of sedation, the patient's esophagus intubated and advanced under direct visualization to second portion of the duodenum.  Scope was removed slowly by carefully examining the color, texture, anatomy and integrity of the mucosa on the way out.  The patient's daughter Alvira Philips gave permission for the esophageal dilation.  The patient's esophagus was intubated under direct visualization and advanced to the antrum.  The Savary guidewire was placed.  The esophagus was dilated from 12.8 to 16-mm.  The dilator was passed with mild resistance.  The dilator and guidewire were removed. The patient was recovered in endoscopy and discharged home in satisfactory condition.  PATH: H. PYLORI GASTRITIS-Rx ABO BID x 10 DAYS   Jonette Eva, M.D.     SF/MEDQ  D:  06/12/2010  T:  06/12/2010  Job:  [id number]  cc:   Milus Mallick. Lodema Hong, M.D. Fax: [phone]  Electronically Signed by Jonette Eva M.D. on 07/07/2010 03:20:44 PM

## 2010-07-24 ENCOUNTER — Encounter: Payer: Self-pay | Admitting: Family Medicine

## 2010-07-28 ENCOUNTER — Encounter: Payer: Self-pay | Admitting: Family Medicine

## 2010-07-28 ENCOUNTER — Ambulatory Visit: Payer: Medicare Other | Admitting: Family Medicine

## 2010-08-05 ENCOUNTER — Telehealth: Payer: Self-pay | Admitting: Family Medicine

## 2010-08-05 ENCOUNTER — Other Ambulatory Visit: Payer: Self-pay | Admitting: Family Medicine

## 2010-08-05 MED ORDER — VITAMIN D (ERGOCALCIFEROL) 1.25 MG (50000 UNIT) PO CAPS: 50000.0000 [IU] | ORAL_CAPSULE | ORAL | Status: DC

## 2010-08-05 NOTE — Telephone Encounter (Signed)
Med sent as requested 

## 2010-09-09 ENCOUNTER — Encounter: Payer: Self-pay | Admitting: Family Medicine

## 2010-09-10 ENCOUNTER — Encounter: Payer: Self-pay | Admitting: Family Medicine

## 2010-09-10 ENCOUNTER — Ambulatory Visit (INDEPENDENT_AMBULATORY_CARE_PROVIDER_SITE_OTHER): Payer: Medicare Other | Admitting: Family Medicine

## 2010-09-10 VITALS — BP 130/78 | HR 68 | Resp 16 | Ht 67.0 in | Wt 118.0 lb

## 2010-09-10 DIAGNOSIS — F29 Unspecified psychosis not due to a substance or known physiological condition: Secondary | ICD-10-CM

## 2010-09-10 DIAGNOSIS — R6251 Failure to thrive (child): Secondary | ICD-10-CM

## 2010-09-10 DIAGNOSIS — I10 Essential (primary) hypertension: Secondary | ICD-10-CM

## 2010-09-10 MED ORDER — POTASSIUM CHLORIDE CRYS ER 10 MEQ PO TBCR: 10.0000 meq | EXTENDED_RELEASE_TABLET | Freq: Two times a day (BID) | ORAL | Status: DC

## 2010-09-10 MED ORDER — TRIAMTERENE-HCTZ 75-50 MG PO TABS: 1.0000 | ORAL_TABLET | Freq: Every day | ORAL | Status: DC

## 2010-09-10 NOTE — Patient Instructions (Addendum)
F/u in mid December.   Mammogram past due  pls schedule.  Keep up the gREAT work!!!

## 2010-09-14 ENCOUNTER — Telehealth: Payer: Self-pay | Admitting: Urgent Care

## 2010-09-14 ENCOUNTER — Ambulatory Visit: Payer: Medicare Other | Admitting: Urgent Care

## 2010-09-14 NOTE — Telephone Encounter (Signed)
Please let Dr Lodema Hong know. Pt NS today. Thanks

## 2010-09-14 NOTE — Telephone Encounter (Signed)
Dr. Simpson is aware °

## 2010-09-29 NOTE — Assessment & Plan Note (Addendum)
Slight increase in weight , pt encouraged to continue same

## 2010-09-29 NOTE — Assessment & Plan Note (Signed)
Unchanged, pt refusing to take med, daughter states her eating has improved slightly. Social services has done a home eval

## 2010-09-29 NOTE — Assessment & Plan Note (Signed)
Controlled, no change in medication  

## 2010-09-29 NOTE — Progress Notes (Signed)
  Subjective:    Patient ID: Gina Black, female    DOB: 12-12-1955, 55 y.o.   MRN: [id number]  HPI The PT is here for follow up and re-evaluation of chronic medical conditions, medication management and review of any available recent lab and radiology data.  Preventive health is updated, specifically  Cancer screening and Immunization.   cQuestions or concerns regardonsultations or procedures which the PT has had in the interim are  addressed. The PT denies any adverse reactions to current medications since the last visit.  She still refuses any antipsychotic med. Her daughter who accompanies her seems less stressed and concerned about her condition, states she is doing better     Review of Systems    Denies recent fever or chills. Denies sinus pressure, nasal congestion, ear pain or sore throat. Denies chest congestion, productive cough or wheezing. Denies chest pains, palpitations and leg swelling Denies abdominal pain, nausea, vomiting,diarrhea or constipation.   Denies dysuria, frequency, hesitancy or incontinence. Denies joint pain, swelling and limitation in mobility. Denies headaches, seizures, numbness, or tingling. Denies depression, anxiety or insomnia. Denies skin break down or rash.     Objective:   Physical Exam Patient alert and oriented and in no cardiopulmonary distress.  HEENT: No facial asymmetry, EOMI, no sinus tenderness,  oropharynx pink and moist.  Neck supple no adenopathy.  Chest: Clear to auscultation bilaterally.  CVS: S1, S2 no murmurs, no S3.  ABD: Soft non tender. Bowel sounds normal.  Ext: No edema  MS: Adequate ROM spine, shoulders, hips and knees.  Skin: Intact, no ulcerations or rash noted.  Psych: Good eye contact, normal affect. Memory loss not anxious or depressed appearing.  CNS: CN 2-12 intact, power, tone and sensation normal throughout.        Assessment & Plan:

## 2010-11-06 LAB — BASIC METABOLIC PANEL
BUN: 8
CO2: 30
Calcium: 9.6
Chloride: 96
Creatinine, Ser: 0.92
GFR calc Af Amer: >60
GFR calc non Af Amer: >60
Glucose, Bld: 128 — ABNORMAL HIGH
Sodium: 132 — ABNORMAL LOW

## 2010-11-06 LAB — ETHANOL: Alcohol, Ethyl (B): <5

## 2010-11-06 LAB — DIFFERENTIAL
Basophils Absolute: 0
Basophils Relative: 0
Eosinophils Absolute: 0.1
Eosinophils Relative: 1
Lymphocytes Relative: 30
Lymphs Abs: 1.6
Monocytes Absolute: 0.6
Monocytes Relative: 12
Neutro Abs: 3
Neutrophils Relative %: 56

## 2010-11-06 LAB — RAPID URINE DRUG SCREEN, HOSP PERFORMED
Amphetamines: NOT DETECTED
Barbiturates: NOT DETECTED
Benzodiazepines: NOT DETECTED
Cocaine: NOT DETECTED
Opiates: NOT DETECTED
Tetrahydrocannabinol: NOT DETECTED

## 2010-11-06 LAB — CBC
HCT: 41.9
Hemoglobin: 14.1
MCHC: 33.7
MCV: 85.6
Platelets: 208
RBC: 4.9
RDW: 13.1
WBC: 5.2

## 2010-11-06 LAB — POTASSIUM: Potassium: 3.4 — ABNORMAL LOW

## 2011-01-14 ENCOUNTER — Encounter: Payer: Self-pay | Admitting: Family Medicine

## 2011-01-19 ENCOUNTER — Encounter: Payer: Self-pay | Admitting: Family Medicine

## 2011-01-19 ENCOUNTER — Ambulatory Visit (INDEPENDENT_AMBULATORY_CARE_PROVIDER_SITE_OTHER): Payer: Medicare Other | Admitting: Family Medicine

## 2011-01-19 VITALS — BP 120/82 | HR 60 | Resp 18 | Ht 67.0 in | Wt 116.1 lb

## 2011-01-19 DIAGNOSIS — R4182 Altered mental status, unspecified: Secondary | ICD-10-CM

## 2011-01-19 DIAGNOSIS — R5383 Other fatigue: Secondary | ICD-10-CM

## 2011-01-19 DIAGNOSIS — Z79899 Other long term (current) drug therapy: Secondary | ICD-10-CM

## 2011-01-19 DIAGNOSIS — F29 Unspecified psychosis not due to a substance or known physiological condition: Secondary | ICD-10-CM

## 2011-01-19 DIAGNOSIS — I10 Essential (primary) hypertension: Secondary | ICD-10-CM

## 2011-01-19 DIAGNOSIS — R6251 Failure to thrive (child): Secondary | ICD-10-CM

## 2011-01-19 DIAGNOSIS — R634 Abnormal weight loss: Secondary | ICD-10-CM

## 2011-01-19 MED ORDER — POTASSIUM CHLORIDE CRYS ER 10 MEQ PO TBCR: 10.0000 meq | EXTENDED_RELEASE_TABLET | Freq: Two times a day (BID) | ORAL | Status: DC

## 2011-01-19 MED ORDER — TRIAMTERENE-HCTZ 75-50 MG PO TABS: 1.0000 | ORAL_TABLET | Freq: Every day | ORAL | Status: DC

## 2011-01-19 NOTE — Patient Instructions (Addendum)
F/u mid April.  Chem7 and  tsh today  Flu vaccine today  Please plan to gain 1 to 1.5 pounds each month.  Mammogram is past due, needs to be done  BP is excellent, no medication changes.  Altamese Cabal Christmas and all the best for 2013!  CBC Fasting lipid, chem 7  In April

## 2011-01-19 NOTE — Progress Notes (Signed)
  Subjective:    Patient ID: Gina Black, female    DOB: [birth year], 55 y.o.   MRN: [id number]  HPI The PT is here for follow up and re-evaluation of chronic medical conditions, medication management and review of any available recent lab and radiology data.  Preventive health is updated, specifically  Cancer screening and Immunization.   Pt is accompanied by her daughter, improvement in relationship observed, daughter fairly satisfied with mother's condition i the past several months, though still would prefer her to increase food intake and take med for her mental illness, however unable to force her to do this and she is not a threat to herself or to anyone     Review of Systems See HPI Denies recent fever or chills. Denies sinus pressure, nasal congestion, ear pain or sore throat. Denies chest congestion, productive cough or wheezing. Denies chest pains, palpitations and leg swelling Denies abdominal pain, nausea, vomiting,diarrhea or constipation.   Denies dysuria, frequency, hesitancy or incontinence. Denies joint pain, swelling and limitation in mobility. Denies headaches, seizures, numbness, or tingling. Denies depression, anxiety or insomnia. Denies skin break down or rash.        Objective:   Physical Exam Patient alert and  in no cardiopulmonary distress.  HEENT: No facial asymmetry, EOMI, no sinus tenderness,  oropharynx pink and moist.  Neck supple no adenopathy.  Chest: Clear to auscultation bilaterally.  CVS: S1, S2 no murmurs, no S3.  ABD: Soft non tender. Bowel sounds normal.  Ext: No edema  MS: Adequate ROM spine, shoulders, hips and knees.  Skin: Intact, no ulcerations or rash noted.  Psych: Good eye contact,flat affect. Memory loss, depressed appearing.  CNS: CN 2-12 intact, power, tone and sensation normal throughout.        Assessment & Plan:

## 2011-01-20 ENCOUNTER — Encounter: Payer: Self-pay | Admitting: Family Medicine

## 2011-01-20 LAB — COMPREHENSIVE METABOLIC PANEL
ALT: 14 U/L (ref 0–35)
CO2: 30 mEq/L (ref 19–32)
Calcium: 9.7 mg/dL (ref 8.4–10.5)
Chloride: 100 mEq/L (ref 96–112)
Glucose, Bld: 78 mg/dL (ref 70–99)
Sodium: 138 mEq/L (ref 135–145)
Total Bilirubin: 1 mg/dL (ref 0.3–1.2)
Total Protein: 7.6 g/dL (ref 6.0–8.3)

## 2011-01-20 LAB — TSH: TSH: 1.179 u[IU]/mL (ref 0.350–4.500)

## 2011-01-24 NOTE — Assessment & Plan Note (Signed)
Controlled, no change in medication  

## 2011-01-24 NOTE — Assessment & Plan Note (Signed)
Deterioration at this visit, the importance of weight gain is stressed

## 2011-01-24 NOTE — Assessment & Plan Note (Signed)
Unwilling to take prescription medication

## 2011-03-03 ENCOUNTER — Telehealth: Payer: Self-pay | Admitting: Family Medicine

## 2011-04-02 NOTE — Telephone Encounter (Signed)
Daughter said to forget about this

## 2011-06-30 ENCOUNTER — Ambulatory Visit: Payer: Medicare Other | Admitting: Family Medicine

## 2011-07-02 ENCOUNTER — Other Ambulatory Visit: Payer: Self-pay | Admitting: Family Medicine

## 2011-07-02 DIAGNOSIS — Z139 Encounter for screening, unspecified: Secondary | ICD-10-CM

## 2011-07-03 DIAGNOSIS — I10 Essential (primary) hypertension: Secondary | ICD-10-CM | POA: Diagnosis not present

## 2011-07-03 DIAGNOSIS — Z79899 Other long term (current) drug therapy: Secondary | ICD-10-CM | POA: Diagnosis not present

## 2011-07-03 LAB — CBC
MCV: 79.2 fL (ref 78.0–100.0)
Platelets: 171 10*3/uL (ref 150–400)
RBC: 4.62 MIL/uL (ref 3.87–5.11)
RDW: 13 % (ref 11.5–15.5)
WBC: 5.7 10*3/uL (ref 4.0–10.5)

## 2011-07-03 LAB — COMPREHENSIVE METABOLIC PANEL
Alkaline Phosphatase: 65 U/L (ref 39–117)
BUN: 14 mg/dL (ref 6–23)
CO2: 29 mEq/L (ref 19–32)
Creat: 0.75 mg/dL (ref 0.50–1.10)
Glucose, Bld: 80 mg/dL (ref 70–99)
Total Bilirubin: 1.7 mg/dL — ABNORMAL HIGH (ref 0.3–1.2)
Total Protein: 7.8 g/dL (ref 6.0–8.3)

## 2011-07-03 LAB — LIPID PANEL
Cholesterol: 190 mg/dL (ref 0–200)
HDL: 63 mg/dL (ref >39)
Total CHOL/HDL Ratio: 3 Ratio
Triglycerides: 54 mg/dL (ref <150)

## 2011-07-07 ENCOUNTER — Encounter: Payer: Self-pay | Admitting: Family Medicine

## 2011-07-07 ENCOUNTER — Ambulatory Visit (INDEPENDENT_AMBULATORY_CARE_PROVIDER_SITE_OTHER): Payer: Medicare Other | Admitting: Family Medicine

## 2011-07-07 VITALS — BP 130/84 | HR 61 | Resp 16 | Ht 67.0 in | Wt 123.0 lb

## 2011-07-07 DIAGNOSIS — R6251 Failure to thrive (child): Secondary | ICD-10-CM

## 2011-07-07 DIAGNOSIS — F29 Unspecified psychosis not due to a substance or known physiological condition: Secondary | ICD-10-CM | POA: Diagnosis not present

## 2011-07-07 DIAGNOSIS — E785 Hyperlipidemia, unspecified: Secondary | ICD-10-CM | POA: Diagnosis not present

## 2011-07-07 DIAGNOSIS — I10 Essential (primary) hypertension: Secondary | ICD-10-CM

## 2011-07-07 NOTE — Progress Notes (Signed)
  Subjective:    Patient ID: Gina Black, female    DOB: 08/10/1955, 56 y.o.   MRN: [id number]  HPI The PT is here for follow up and re-evaluation of chronic medical conditions, medication management and review of any available recent lab and radiology data.  Preventive health is updated, specifically  Cancer screening and Immunization.    The PT denies any adverse reactions to current medications since the last visit. She has been taking the maxzide only "as neede  For leg swelling" and has a bottle with script filled 5 months ago   There are no specific  Daughter has a form which will need to be completed      Review of Systems See HPI Denies recent fever or chills. Denies sinus pressure, nasal congestion, ear pain or sore throat. Denies chest congestion, productive cough or wheezing. Denies chest pains, palpitations and leg swelling Denies abdominal pain, nausea, vomiting,diarrhea or constipation.   Denies dysuria, frequency, hesitancy or incontinence. Denies joint pain, swelling and limitation in mobility. Denies headaches, seizures, numbness, or tingling. Denies depression, anxiety or insomnia. Denies skin break down or rash.        Objective:   Physical Exam Patient alert  and in no cardiopulmonary distress.  HEENT: No facial asymmetry, EOMI, no sinus tenderness,  oropharynx pink and moist.  Neck supple no adenopathy.  Chest: Clear to auscultation bilaterally.  CVS: S1, S2 no murmurs, no S3.  ABD: Soft non tender. Bowel sounds normal.  Ext: No edema  MS: Adequate ROM spine, shoulders, hips and knees.  Skin: Intact, no ulcerations or rash noted.  Psych: Good eye contact, blunt affect. Memory loss not anxious or depressed appearing.  CNS: CN 2-12 intact, power, tone and sensation normal throughout.        Assessment & Plan:

## 2011-07-07 NOTE — Patient Instructions (Signed)
F/u in 4.5 month  Labs are excellent and you have gained weight  pLEASE take calcium with D one twice daily, one baby aspirin 81mg  daily, and one multivitamin daily, liquid or tablet

## 2011-07-09 ENCOUNTER — Ambulatory Visit (HOSPITAL_COMMUNITY)
Admission: RE | Admit: 2011-07-09 | Discharge: 2011-07-09 | Disposition: A | Discharge status: Home / Self Care | Payer: Medicare Other | Source: Ambulatory Visit | Attending: Family Medicine | Admitting: Family Medicine

## 2011-07-09 DIAGNOSIS — Z139 Encounter for screening, unspecified: Secondary | ICD-10-CM

## 2011-07-09 DIAGNOSIS — Z1231 Encounter for screening mammogram for malignant neoplasm of breast: Secondary | ICD-10-CM | POA: Insufficient documentation

## 2011-07-11 DIAGNOSIS — E785 Hyperlipidemia, unspecified: Secondary | ICD-10-CM | POA: Insufficient documentation

## 2011-07-11 NOTE — Assessment & Plan Note (Signed)
Normotensive and not taking medication daily as evidenced by the fact that current bottle filled 5 months ago. states take med for leg swelling as needed, will formally discontinue the medication and call problem resolved, will remove problem at next visit if still normotensive

## 2011-07-11 NOTE — Assessment & Plan Note (Signed)
Minimally functional off medication which she absolutely refuses to take, daughter involved in overseeing her care

## 2011-07-11 NOTE — Assessment & Plan Note (Signed)
Elevated lDL, advised of the importance of reduction in unhealthy snacks to improve this

## 2011-07-11 NOTE — Assessment & Plan Note (Signed)
Gradual weight gain over last several months, pt encouraged to continue same

## 2011-12-13 ENCOUNTER — Encounter: Payer: Self-pay | Admitting: Family Medicine

## 2011-12-13 ENCOUNTER — Ambulatory Visit (INDEPENDENT_AMBULATORY_CARE_PROVIDER_SITE_OTHER): Payer: Medicare Other | Admitting: Family Medicine

## 2011-12-13 VITALS — BP 140/88 | HR 68 | Resp 15 | Ht 67.0 in | Wt 131.0 lb

## 2011-12-13 DIAGNOSIS — F29 Unspecified psychosis not due to a substance or known physiological condition: Secondary | ICD-10-CM | POA: Diagnosis not present

## 2011-12-13 DIAGNOSIS — R6251 Failure to thrive (child): Secondary | ICD-10-CM

## 2011-12-13 DIAGNOSIS — I10 Essential (primary) hypertension: Secondary | ICD-10-CM | POA: Diagnosis not present

## 2011-12-13 DIAGNOSIS — Z23 Encounter for immunization: Secondary | ICD-10-CM

## 2011-12-13 NOTE — Progress Notes (Signed)
  Subjective:    Patient ID: Gina Black, female    DOB: 02/07/1956, 56 y.o.   MRN: [id number]  HPI The PT is here for follow up and re-evaluation of chronic medical conditions, medication management and review of any available recent lab and radiology data.  Preventive health is updated, specifically  Cancer screening and Immunization.     There are no new concerns. There are no specific complaints from patient. Daughter is concerned that her mom's diet is poor she eats a lot of snacks and salty foods, and pt c/o mild leg and ankle swelling in the evenings, her blood pressure is slightly elevated and I link all three      Review of Systems See HPI Denies recent fever or chills. Denies sinus pressure, nasal congestion, ear pain or sore throat. Denies chest congestion, productive cough or wheezing. Denies chest pains, palpitations and leg swelling Denies abdominal pain, nausea, vomiting,diarrhea or constipation.   Denies dysuria, frequency, hesitancy or incontinence. Denies joint pain, swelling and limitation in mobility. Denies headaches, seizures, numbness, or tingling. Denies depression, anxiety or insomnia. Denies skin break down or rash.        Objective:   Physical Exam  Patient alert and in no cardiopulmonary distress.Blunted affect, no insight into her health  HEENT: No facial asymmetry, EOMI, no sinus tenderness,  Oropharynx moist.  Neck supple no adenopathy.  Chest: Clear to auscultation bilaterally.  CVS: S1, S2 no murmurs, no S3.  ABD: Soft non tender. Bowel sounds normal.  Ext: trace  edema  MS: Adequate ROM spine, shoulders, hips and knees.  Skin: Intact, no ulcerations or rash noted.  Psych: Poor  eye contact, blunted  affect. Memory impaired  not anxious or depressed appearing.  CNS: CN 2-12 intact, power, tone and sensation normal throughout.       Assessment & Plan:

## 2011-12-13 NOTE — Patient Instructions (Addendum)
Pelvic and breast in 4.5 month  Flu vaccine today.  I am happy that you have gained weight.  Blood pressure is up and legs slightly swollen due to excess salt in diet, from potato chips and peanuts. Eat more fresh fruit and vegetable, and home cooked healthy meals please  Please take one multivitamin once daily and one baby aspirin to reduce stroke risk

## 2011-12-19 NOTE — Assessment & Plan Note (Signed)
Unchanged, not suicidal or homicidal Pt continues to refuse to take meds recommended

## 2011-12-19 NOTE — Assessment & Plan Note (Signed)
Elevated blood pressure once more, no meds at this time, problem is due to diet which she neds to change

## 2011-12-19 NOTE — Assessment & Plan Note (Signed)
Improved , with weight gain, however daughter reports poor quality of diet with excessive intake of salty snacks

## 2011-12-20 DIAGNOSIS — Z23 Encounter for immunization: Secondary | ICD-10-CM

## 2012-04-25 ENCOUNTER — Ambulatory Visit (INDEPENDENT_AMBULATORY_CARE_PROVIDER_SITE_OTHER): Payer: Medicare Other | Admitting: Family Medicine

## 2012-04-25 ENCOUNTER — Encounter: Payer: Self-pay | Admitting: Family Medicine

## 2012-04-25 VITALS — BP 180/90 | HR 73 | Resp 18 | Ht 67.0 in | Wt 135.0 lb

## 2012-04-25 DIAGNOSIS — F29 Unspecified psychosis not due to a substance or known physiological condition: Secondary | ICD-10-CM | POA: Diagnosis not present

## 2012-04-25 DIAGNOSIS — I10 Essential (primary) hypertension: Secondary | ICD-10-CM

## 2012-04-25 DIAGNOSIS — E785 Hyperlipidemia, unspecified: Secondary | ICD-10-CM | POA: Diagnosis not present

## 2012-04-25 DIAGNOSIS — R5381 Other malaise: Secondary | ICD-10-CM

## 2012-04-25 DIAGNOSIS — R5383 Other fatigue: Secondary | ICD-10-CM

## 2012-04-25 MED ORDER — LOSARTAN POTASSIUM 50 MG PO TABS: 50.0000 mg | ORAL_TABLET | Freq: Every day | ORAL | Status: DC

## 2012-04-25 NOTE — Patient Instructions (Addendum)
F/u in 2 month, please call if you need me before  You need to start medication for your blood pressure.  You are being referred to [facility], in the hope that your overall health will improve, your daughter will also be involved in this.  You need to increase the fruit and vegetables in your diet and stop the snacks, salty foods and junk, this is a part of the blood pressure problem  Fasting CBc, lipid, cmp and TSH in 32 months before visit

## 2012-04-26 NOTE — Progress Notes (Signed)
  Subjective:    Patient ID: Gina Black, female    DOB: October 02, 1955, 57 y.o.   MRN: [id number]  HPI Pt in with her daughter who is extremely frustrated and at her wits end, as to how she can continue to care for her mother, who in her opinion, remains demanding and uncooperative. Reports her mom insists on having her way, she eats a lot of salty junk food, c/o leg swellingand wants back her blood pressure med. Daughter states she has to call to tell her Mom to clean the house,remove the trash, and still has to go down from [place] once to twice per week, to get these things done Mother c/o not seeing enough of her daughter, that she wishes daughter would take her out for breakfast and wants to live with her daughter. Not an option as far as her daughter is concened, and certainly not the desired course of action from what I see. No complaints otherwise    Review of Systems See HPI Denies recent fever or chills. Denies sinus pressure, nasal congestion, ear pain or sore throat. Denies chest congestion, productive cough or wheezing. Denies chest pains, palpitations and leg swelling Denies abdominal pain, nausea, vomiting,diarrhea or constipation.   Denies dysuria, frequency, hesitancy or incontinence. Denies joint pain, swelling and limitation in mobility. Denies headaches, seizures, numbness, or tingling. Denies depression, anxiety or insomnia. Denies skin break down or rash.        Objective:   Physical Exam  Patient alert and oriented and in no cardiopulmonary distress.  HEENT: No facial asymmetry, EOMI, no sinus tenderness,  oropharynx pink and moist.  Neck supple no adenopathy.  Chest: Clear to auscultation bilaterally.  CVS: S1, S2 no murmurs, no S3.  ABD: Soft non tender. Bowel sounds normal.  Ext: No edema  MS: Adequate ROM spine, shoulders, hips and knees.  Skin: Intact, no ulcerations or rash noted.  Psych: Good eye contact, blunted  affect. Memory intact  not anxious or depressed appearing.  CNS: CN 2-12 intact, power,  normal throughout.       Assessment & Plan:

## 2012-04-26 NOTE — Assessment & Plan Note (Addendum)
Poor insight into her inability to take care herself and stubborn attitude to change in behavior with poor relationship with her daughter , who seems to be trying as best she can to care  For her Mom. Will refer urgently to [facility] Denies hallucinations and suicidal or homicidal ideation

## 2012-04-26 NOTE — Assessment & Plan Note (Signed)
Unconmtrolled, needs to resume med, DASH diet and commitment to daily physical activity for a minimum of 30 minutes discussed and encouraged, as a part of hypertension management. The importance of attaining a healthy weight is also discussed. Start losartan

## 2012-04-26 NOTE — Assessment & Plan Note (Signed)
Hyperlipidemia:Low fat diet discussed and encouraged.  Updated lab for next visit 

## 2012-05-15 DIAGNOSIS — M2010 Hallux valgus (acquired), unspecified foot: Secondary | ICD-10-CM | POA: Diagnosis not present

## 2012-05-15 DIAGNOSIS — M79609 Pain in unspecified limb: Secondary | ICD-10-CM | POA: Diagnosis not present

## 2012-05-15 DIAGNOSIS — M2040 Other hammer toe(s) (acquired), unspecified foot: Secondary | ICD-10-CM | POA: Diagnosis not present

## 2012-06-02 ENCOUNTER — Telehealth: Payer: Self-pay | Admitting: Family Medicine

## 2012-06-02 DIAGNOSIS — I10 Essential (primary) hypertension: Secondary | ICD-10-CM

## 2012-06-02 MED ORDER — LOSARTAN POTASSIUM 50 MG PO TABS: 50.0000 mg | ORAL_TABLET | Freq: Every day | ORAL | Status: DC

## 2012-06-02 NOTE — Telephone Encounter (Signed)
Patient is not on maxzide. It was losartan that was started at [facility]. Refill sent in

## 2012-07-25 ENCOUNTER — Ambulatory Visit: Payer: Medicare Other | Admitting: Family Medicine

## 2012-08-05 DIAGNOSIS — R5381 Other malaise: Secondary | ICD-10-CM | POA: Diagnosis not present

## 2012-08-05 DIAGNOSIS — E785 Hyperlipidemia, unspecified: Secondary | ICD-10-CM | POA: Diagnosis not present

## 2012-08-05 DIAGNOSIS — I10 Essential (primary) hypertension: Secondary | ICD-10-CM | POA: Diagnosis not present

## 2012-08-05 LAB — COMPREHENSIVE METABOLIC PANEL
AST: 26 U/L (ref 0–37)
Albumin: 4 g/dL (ref 3.5–5.2)
BUN: 15 mg/dL (ref 6–23)
Calcium: 9.1 mg/dL (ref 8.4–10.5)
Total Protein: 7.5 g/dL (ref 6.0–8.3)

## 2012-08-05 LAB — CBC
Hemoglobin: 12.6 g/dL (ref 12.0–15.0)
MCH: 27.6 pg (ref 26.0–34.0)
MCHC: 35.5 g/dL (ref 30.0–36.0)
RDW: 13.4 % (ref 11.5–15.5)

## 2012-08-05 LAB — TSH: TSH: 1.229 u[IU]/mL (ref 0.350–4.500)

## 2012-08-05 LAB — LIPID PANEL
Cholesterol: 191 mg/dL (ref 0–200)
HDL: 64 mg/dL (ref >39)
LDL Cholesterol: 120 mg/dL — ABNORMAL HIGH (ref 0–99)
Triglycerides: 34 mg/dL (ref <150)
VLDL: 7 mg/dL (ref 0–40)

## 2012-08-09 ENCOUNTER — Encounter: Payer: Self-pay | Admitting: Family Medicine

## 2012-08-09 ENCOUNTER — Ambulatory Visit (INDEPENDENT_AMBULATORY_CARE_PROVIDER_SITE_OTHER): Payer: Medicare Other | Admitting: Family Medicine

## 2012-08-09 VITALS — BP 130/72 | HR 68 | Resp 18 | Ht 67.0 in | Wt 135.1 lb

## 2012-08-09 DIAGNOSIS — I10 Essential (primary) hypertension: Secondary | ICD-10-CM | POA: Diagnosis not present

## 2012-08-09 DIAGNOSIS — F29 Unspecified psychosis not due to a substance or known physiological condition: Secondary | ICD-10-CM

## 2012-08-09 DIAGNOSIS — Z9119 Patient's noncompliance with other medical treatment and regimen: Secondary | ICD-10-CM | POA: Diagnosis not present

## 2012-08-09 DIAGNOSIS — E785 Hyperlipidemia, unspecified: Secondary | ICD-10-CM | POA: Diagnosis not present

## 2012-08-09 DIAGNOSIS — R634 Abnormal weight loss: Secondary | ICD-10-CM

## 2012-08-09 DIAGNOSIS — Z91199 Patient's noncompliance with other medical treatment and regimen due to unspecified reason: Secondary | ICD-10-CM

## 2012-08-09 NOTE — Patient Instructions (Addendum)
Pelvic and breast in 4 month, please call if you need me before  Labs are excellent please continue to eat regularly  Please work on cleaning  Laundry room in the next 2 month  Your daughter will schedule your mammogram, we will provide with her the number to call

## 2012-08-27 DIAGNOSIS — Z9119 Patient's noncompliance with other medical treatment and regimen: Secondary | ICD-10-CM | POA: Insufficient documentation

## 2012-08-27 NOTE — Assessment & Plan Note (Signed)
Reversed, weight gain since last visit

## 2012-08-27 NOTE — Progress Notes (Signed)
  Subjective:    Patient ID: Gina Black, female    DOB: 25-Jan-1956, 57 y.o.   MRN: [id number]  HPI Pt in with her daughter for f/u  As usual she has no complaints and states tha tall is well. Behavior is unchanged at home, eats a lot of snacks, oes not take medication reguarly as prescribed, and housekeeping needs to im;prove   Review of Systems See HPI Denies recent fever or chills. Denies sinus pressure, nasal congestion, ear pain or sore throat. Denies chest congestion, productive cough or wheezing. Denies chest pains, palpitations and leg swelling Denies abdominal pain, nausea, vomiting,diarrhea or constipation.   Denies dysuria, frequency, hesitancy or incontinence. Denies joint pain, swelling and limitation in mobility. Denies headaches, seizures, numbness, or tingling. Denies depression, anxiety or insomnia. Denies skin break down or rash.        Objective:   Physical Exam  Patient alert and oriented and in no cardiopulmonary distress.  HEENT: No facial asymmetry, EOMI, no sinus tenderness,  oropharynx pink and moist.  Neck supple no adenopathy.  Chest: Clear to auscultation bilaterally.  CVS: S1, S2 no murmurs, no S3.  ABD: Soft non tender. Bowel sounds normal.  Ext: No edema  MS: Adequate ROM spine, shoulders, hips and knees.  Skin: Intact, no ulcerations or rash noted.  Psych: Good eye contact, flat affect. Memory impaired , not  anxious or depressed appearing.No insight into her mental illness and denial of same  CNS: CN 2-12 intact, power, tone and sensation normal throughout.       Assessment & Plan:

## 2012-08-27 NOTE — Assessment & Plan Note (Signed)
upodated lab needed Hyperlipidemia:Low fat diet discussed and encouraged.

## 2012-08-27 NOTE — Assessment & Plan Note (Signed)
Uncontrolled, non compliant with medication, importance of same stressed

## 2012-08-27 NOTE — Assessment & Plan Note (Signed)
Ongoing prblem and her nmntal halth makes cooperatio with treatment plan impossible. Currently no perceived threat to herself or other

## 2012-08-27 NOTE — Assessment & Plan Note (Signed)
Ongoing conversations with herself. Denies suicidal or homicidal behavior. No aggressive behavior to daughter at this time

## 2012-12-14 ENCOUNTER — Other Ambulatory Visit: Payer: Self-pay

## 2012-12-18 ENCOUNTER — Ambulatory Visit (INDEPENDENT_AMBULATORY_CARE_PROVIDER_SITE_OTHER): Payer: Medicare Other | Admitting: Family Medicine

## 2012-12-18 ENCOUNTER — Encounter: Payer: Self-pay | Admitting: Family Medicine

## 2012-12-18 ENCOUNTER — Other Ambulatory Visit (HOSPITAL_COMMUNITY)
Admission: RE | Admit: 2012-12-18 | Discharge: 2012-12-18 | Disposition: A | Discharge status: Home / Self Care | Payer: Medicare Other | Source: Ambulatory Visit | Attending: Family Medicine | Admitting: Family Medicine

## 2012-12-18 VITALS — BP 164/82 | HR 82 | Resp 18 | Ht 67.0 in | Wt 139.1 lb

## 2012-12-18 DIAGNOSIS — Z1211 Encounter for screening for malignant neoplasm of colon: Secondary | ICD-10-CM | POA: Diagnosis not present

## 2012-12-18 DIAGNOSIS — I10 Essential (primary) hypertension: Secondary | ICD-10-CM | POA: Diagnosis not present

## 2012-12-18 DIAGNOSIS — IMO0002 Reserved for concepts with insufficient information to code with codable children: Secondary | ICD-10-CM

## 2012-12-18 DIAGNOSIS — Z124 Encounter for screening for malignant neoplasm of cervix: Secondary | ICD-10-CM | POA: Insufficient documentation

## 2012-12-18 DIAGNOSIS — R87619 Unspecified abnormal cytological findings in specimens from cervix uteri: Secondary | ICD-10-CM | POA: Insufficient documentation

## 2012-12-18 DIAGNOSIS — R87810 Cervical high risk human papillomavirus (HPV) DNA test positive: Secondary | ICD-10-CM | POA: Diagnosis not present

## 2012-12-18 DIAGNOSIS — Z01419 Encounter for gynecological examination (general) (routine) without abnormal findings: Secondary | ICD-10-CM | POA: Diagnosis not present

## 2012-12-18 DIAGNOSIS — Z91199 Patient's noncompliance with other medical treatment and regimen due to unspecified reason: Secondary | ICD-10-CM

## 2012-12-18 DIAGNOSIS — Z9119 Patient's noncompliance with other medical treatment and regimen: Secondary | ICD-10-CM

## 2012-12-18 DIAGNOSIS — Z23 Encounter for immunization: Secondary | ICD-10-CM | POA: Diagnosis not present

## 2012-12-18 DIAGNOSIS — Z0000 Encounter for general adult medical examination without abnormal findings: Secondary | ICD-10-CM

## 2012-12-18 LAB — HEMOCCULT GUIAC POC 1CARD (OFFICE): Fecal Occult Blood, POC: NEGATIVE

## 2012-12-18 MED ORDER — TRIAMTERENE-HCTZ 37.5-25 MG PO TABS: 1.0000 | ORAL_TABLET | Freq: Every day | ORAL | Status: DC

## 2012-12-18 NOTE — Patient Instructions (Addendum)
F/u in 3  Month, call if you need me before  TdAP today due to cut on left 4th toe, also flu vaccine.  Blood pressure is high, new BP pill  Once daily, maxzide 25mg  one daily  Cut back on salty foods and junk food, eat a lot of vegetables and fruit  Non Fasting chem 7 about 1 week before next visit please  Use lotion over your entire body twice daily so skin is smooth and moist  For your feet soak every day in epsom salts then use vaseline on the skin

## 2012-12-18 NOTE — Progress Notes (Signed)
  Subjective:    Patient ID: Gina Black, female    DOB: 03/28/1955, 57 y.o.   MRN: [id number]  HPI Pt in for pelvic and breast exam C/o skin breakdown on 4th left oe otherwise no concerns voiced by pt or her daughter who is responsible for her care   Review of Systems See HPI Denies recent fever or chills. Denies sinus pressure, nasal congestion, ear pain or sore throat. Denies chest congestion, productive cough or wheezing. Denies chest pains, palpitations and leg swelling Denies abdominal pain, nausea, vomiting,diarrhea or constipation.   Denies dysuria, frequency, hesitancy or incontinence. Denies joint pain, swelling and limitation in mobility. Denies headaches, seizures, numbness, or tingling. Denies depression, anxiety or insomnia. .        Objective:   Physical Exam  Pleasant well nourished female, alert  in no cardio-pulmonary distress. Afebrile. HEENT No facial trauma or asymetry. Sinuses non tender.  EOMI, PERTL, fundoscopic exam is normal, no hemorhage or exudate.  External ears normal, tympanic poor  good dentition. Neck: supple, no adenopathy,JVD or thyromegaly.No bruits.  Chest: Clear to ascultation bilaterally.No crackles or wheezes. Non tender to palpation  Breast: No asymetry,no masses. No nipple discharge or inversion. No axillary or supraclavicular adenopathy  Cardiovascular system; Heart sounds normal,  S1 and  S2 ,no S3.  No murmur, or thrill. Apical beat not displaced Peripheral pulses normal.  Abdomen: Soft, non tender, no organomegaly or masses. No bruits. Bowel sounds normal. No guarding, tenderness or rebound.  Rectal:  No mass. Guaiac negative stool.  GU: External genitalia normal. No lesions. Vaginal canal normal.No discharge. Uterus normal size, no adnexal masses, no cervical motion or adnexal tenderness.  Musculoskeletal exam: Full ROM of spine, hips , shoulders and knees. No deformity ,swelling or crepitus  noted. No muscle wasting or atrophy.   Neurologic: Cranial nerves 2 to 12 intact. Power, tone ,sensation and reflexes normal throughout. No disturbance in gait. No tremor.  Skin: Thickening and laceration  noted on left 4th toe. Pigmentation normal throughout  Psych; Normal mood and abnormal affect. Judgement impaired due to mental health illness,and concentration is abnormal       Assessment & Plan:

## 2012-12-31 DIAGNOSIS — Z0000 Encounter for general adult medical examination without abnormal findings: Secondary | ICD-10-CM | POA: Insufficient documentation

## 2012-12-31 DIAGNOSIS — IMO0002 Reserved for concepts with insufficient information to code with codable children: Secondary | ICD-10-CM | POA: Insufficient documentation

## 2012-12-31 NOTE — Assessment & Plan Note (Signed)
TdaP at visit.improved hygiene and foot care needed. Topical antibiotic only Regular use of skin moisturizer needed as skin is extremely dry

## 2012-12-31 NOTE — Assessment & Plan Note (Signed)
Pelvic and breast exam as documented Lifestyle change to include regular exercise and improved diet encouraged

## 2012-12-31 NOTE — Assessment & Plan Note (Signed)
Uncontrolled. DASH diet and commitment to daily physical activity for a minimum of 30 minutes discussed and encouraged, as a part of hypertension management. The importance of attaining a healthy weight is also discussed.  change in medication

## 2012-12-31 NOTE — Assessment & Plan Note (Signed)
Refuses to change diet as discussed, a;lso will not take medication for mental illness which might improve her level of function. She is not suicidal or homicidal and does not pose a threat to anyone including herself

## 2013-03-17 DIAGNOSIS — I10 Essential (primary) hypertension: Secondary | ICD-10-CM | POA: Diagnosis not present

## 2013-03-17 LAB — BASIC METABOLIC PANEL
BUN: 16 mg/dL (ref 6–23)
CO2: 30 mEq/L (ref 19–32)
Calcium: 9.3 mg/dL (ref 8.4–10.5)
Chloride: 105 mEq/L (ref 96–112)
Creat: 0.85 mg/dL (ref 0.50–1.10)
Glucose, Bld: 81 mg/dL (ref 70–99)
Potassium: 3.7 mEq/L (ref 3.5–5.3)
Sodium: 142 mEq/L (ref 135–145)

## 2013-03-21 ENCOUNTER — Encounter: Payer: Self-pay | Admitting: Family Medicine

## 2013-03-21 ENCOUNTER — Ambulatory Visit (INDEPENDENT_AMBULATORY_CARE_PROVIDER_SITE_OTHER): Payer: Medicare Other | Admitting: Family Medicine

## 2013-03-21 VITALS — BP 120/80 | HR 76 | Resp 18 | Ht 67.0 in | Wt 138.0 lb

## 2013-03-21 DIAGNOSIS — F29 Unspecified psychosis not due to a substance or known physiological condition: Secondary | ICD-10-CM | POA: Diagnosis not present

## 2013-03-21 DIAGNOSIS — Z9119 Patient's noncompliance with other medical treatment and regimen: Secondary | ICD-10-CM

## 2013-03-21 DIAGNOSIS — R5383 Other fatigue: Secondary | ICD-10-CM

## 2013-03-21 DIAGNOSIS — E785 Hyperlipidemia, unspecified: Secondary | ICD-10-CM | POA: Diagnosis not present

## 2013-03-21 DIAGNOSIS — Z91199 Patient's noncompliance with other medical treatment and regimen due to unspecified reason: Secondary | ICD-10-CM

## 2013-03-21 DIAGNOSIS — R5381 Other malaise: Secondary | ICD-10-CM | POA: Diagnosis not present

## 2013-03-21 DIAGNOSIS — I10 Essential (primary) hypertension: Secondary | ICD-10-CM

## 2013-03-21 MED ORDER — ASPIRIN EC 81 MG PO TBEC: 81.0000 mg | DELAYED_RELEASE_TABLET | Freq: Every day | ORAL | Status: DC

## 2013-03-21 NOTE — Patient Instructions (Addendum)
F/u in end June, call if you need me before  You are referred for evaluation at behavioral health , you need to go, so you can get medication that will help you to be able to continue to live in your home You need to start aspirin 81 mg one daily for protection against a stroke  Blood pressure and blood work `are good  You need to keep your house clean, and take care of your dog   Fasting lipid, chem 7, CBC, tSH , CBC end June

## 2013-04-01 NOTE — Assessment & Plan Note (Addendum)
Longstanding mental health illness, with non compliance with treatment. Her only daughter does try to take care, we have even had social services involved. Pt lacks insight and is in denial. Daughter frustrated, her mother is very dependent and very demanding , and still neither complies with medication , diet and continues to demonstrate inca[pabilkity of adequately caring for herself and living in a clean safe environment. She understands if this continues she will need to live in a family home, she does not want this and is attached to her dog Denies suicidal or homicidal thoughts. Wads made disabled based on her mental health approx 10 years ago

## 2013-04-01 NOTE — Assessment & Plan Note (Signed)
Elevated LDL, change in diet will address this again pt counseled re need to reduce processed snacks

## 2013-04-01 NOTE — Progress Notes (Signed)
   Subjective:    Patient ID: Gina Black, female    DOB: 22-Sep-1955, 58 y.o.   MRN: [id number]  HPI The PT is here for follow up and re-evaluation of chronic medical conditions, medication management and review of any available recent lab and radiology data.  Preventive health is updated, specifically  Cancer screening and Immunization.    The PT denies any adverse reactions to current medications since the last visit. Still non compliant with anything and any medication she does not want to take or to do Daughter frustrated and upset, mother had not gotten out of bed before she left work 1.5 hrs earlier than needed to ensure she got to the visit. Found  The floor messed with dog excreta pt had made no attempt to clean this up      Review of Systems See HPI Pt has no concerns states she feels well, daughter is correctly concerned about her Mother's failng mental health condiiton since she refuses treatment interested in psych eval Denies recent fever or chills. Denies sinus pressure, nasal congestion, ear pain or sore throat. Denies chest congestion, productive cough or wheezing. Denies chest pains, palpitations and leg swelling Denies abdominal pain, nausea, vomiting,diarrhea or constipation.   Denies dysuria, frequency, hesitancy or incontinence. Denies joint pain, swelling and limitation in mobility. Denies headaches, seizures, numbness, or tingling. Denies depression, anxiety or insomnia. Denies skin break down or rash.        Objective:   Physical Exam BP 120/80  Pulse 76  Resp 18  Ht 5\' 7"  (1.702 m)  Wt 138 lb (62.596 kg)  BMI 21.61 kg/m2  SpO2 99% Patient alert and oriented and in no cardiopulmonary distress.  HEENT: No facial asymmetry, EOMI, no sinus tenderness,  oropharynx pink and moist.  Neck supple no adenopathy.  Chest: Clear to auscultation bilaterally.  CVS: S1, S2 no murmurs, no S3.  ABD: Soft non tender. Bowel sounds normal.  Ext: No  edema  MS: Adequate ROM spine, shoulders, hips and knees.  Skin: Intact, no ulcerations or rash noted.  Psych: Good eye contact, flat  affect. Memory iimpaired, poor insight into her illness, psychomotor retardation,depressed appearing.  CNS: CN 2-12 intact, power, tone and sensation normal throughout.        Assessment & Plan:  HYPERTENSION Controlled, no change in medication   Psychotic disorder Longstanding mental health illness, with non compliance with treatment. Her only daughter does try to take care, we have even had social services involved. Pt lacks insight and is in denial. Daughter frustrated, her mother is very dependent and very demanding , and still neither complies with medication , diet and continues to demonstrate inca[pabilkity of adequately caring for herself and living in a clean safe environment. She understands if this continues she will need to live in a family home, she does not want this and is attached to her dog Denies suicidal or homicidal thoughts. Wads made disabled based on her mental health approx 10 years ago  Dyslipidemia Elevated LDL, change in diet will address this again pt counseled re need to reduce processed snacks  Medical non-compliance Ongoing problem ,  Her mental illnes is the prime reason , hopefully psychiatry will be able to appropriately medicate pt soi this improves  FATIGUE Chronic fatigue, will obtan baseline data to asess

## 2013-04-01 NOTE — Assessment & Plan Note (Signed)
Chronic fatigue, will obtan baseline data to asess

## 2013-04-01 NOTE — Assessment & Plan Note (Signed)
Ongoing problem ,  Her mental illnes is the prime reason , hopefully psychiatry will be able to appropriately medicate pt soi this improves

## 2013-04-01 NOTE — Assessment & Plan Note (Signed)
Controlled, no change in medication  

## 2013-04-10 ENCOUNTER — Telehealth (HOSPITAL_COMMUNITY): Payer: Self-pay | Admitting: *Deleted

## 2013-05-10 ENCOUNTER — Other Ambulatory Visit: Payer: Self-pay | Admitting: Family Medicine

## 2013-05-14 ENCOUNTER — Ambulatory Visit (HOSPITAL_COMMUNITY): Payer: Medicare Other | Admitting: Psychiatry

## 2013-05-14 DIAGNOSIS — L608 Other nail disorders: Secondary | ICD-10-CM | POA: Diagnosis not present

## 2013-05-14 DIAGNOSIS — M79609 Pain in unspecified limb: Secondary | ICD-10-CM | POA: Diagnosis not present

## 2013-06-05 DIAGNOSIS — H2510 Age-related nuclear cataract, unspecified eye: Secondary | ICD-10-CM | POA: Diagnosis not present

## 2013-06-05 DIAGNOSIS — Z961 Presence of intraocular lens: Secondary | ICD-10-CM | POA: Diagnosis not present

## 2013-08-07 ENCOUNTER — Ambulatory Visit: Payer: Medicare Other | Admitting: Family Medicine

## 2013-08-29 ENCOUNTER — Other Ambulatory Visit: Payer: Self-pay | Admitting: Family Medicine

## 2013-09-10 DIAGNOSIS — R5381 Other malaise: Secondary | ICD-10-CM | POA: Diagnosis not present

## 2013-09-10 DIAGNOSIS — E785 Hyperlipidemia, unspecified: Secondary | ICD-10-CM | POA: Diagnosis not present

## 2013-09-10 DIAGNOSIS — I10 Essential (primary) hypertension: Secondary | ICD-10-CM | POA: Diagnosis not present

## 2013-09-10 DIAGNOSIS — R5383 Other fatigue: Secondary | ICD-10-CM | POA: Diagnosis not present

## 2013-09-10 LAB — CBC
HCT: 36.1 % (ref 36.0–46.0)
Hemoglobin: 12.2 g/dL (ref 12.0–15.0)
MCH: 27.5 pg (ref 26.0–34.0)
MCHC: 33.8 g/dL (ref 30.0–36.0)
MCV: 81.5 fL (ref 78.0–100.0)
PLATELETS: 190 10*3/uL (ref 150–400)
RBC: 4.43 MIL/uL (ref 3.87–5.11)
RDW: 14.1 % (ref 11.5–15.5)
WBC: 4.7 10*3/uL (ref 4.0–10.5)

## 2013-09-10 LAB — LIPID PANEL
CHOLESTEROL: 187 mg/dL (ref 0–200)
HDL: 69 mg/dL (ref >39)
LDL Cholesterol: 111 mg/dL — ABNORMAL HIGH (ref 0–99)
TRIGLYCERIDES: 37 mg/dL (ref <150)
Total CHOL/HDL Ratio: 2.7 Ratio
VLDL: 7 mg/dL (ref 0–40)

## 2013-09-10 LAB — BASIC METABOLIC PANEL
BUN: 20 mg/dL (ref 6–23)
CHLORIDE: 103 meq/L (ref 96–112)
CO2: 27 meq/L (ref 19–32)
CREATININE: 0.97 mg/dL (ref 0.50–1.10)
Calcium: 9.4 mg/dL (ref 8.4–10.5)
GLUCOSE: 83 mg/dL (ref 70–99)
Potassium: 3.6 mEq/L (ref 3.5–5.3)
Sodium: 140 mEq/L (ref 135–145)

## 2013-09-10 LAB — TSH: TSH: 1.847 u[IU]/mL (ref 0.350–4.500)

## 2013-09-12 ENCOUNTER — Ambulatory Visit (INDEPENDENT_AMBULATORY_CARE_PROVIDER_SITE_OTHER): Payer: Medicare Other | Admitting: Family Medicine

## 2013-09-12 ENCOUNTER — Encounter: Payer: Self-pay | Admitting: Family Medicine

## 2013-09-12 VITALS — BP 140/76 | HR 80 | Resp 18 | Ht 67.0 in | Wt 139.1 lb

## 2013-09-12 DIAGNOSIS — E785 Hyperlipidemia, unspecified: Secondary | ICD-10-CM

## 2013-09-12 DIAGNOSIS — Z23 Encounter for immunization: Secondary | ICD-10-CM | POA: Diagnosis not present

## 2013-09-12 DIAGNOSIS — F24 Shared psychotic disorder: Secondary | ICD-10-CM

## 2013-09-12 DIAGNOSIS — I10 Essential (primary) hypertension: Secondary | ICD-10-CM | POA: Diagnosis not present

## 2013-09-12 NOTE — Assessment & Plan Note (Signed)
Improved, on no medication, with excellent overa;ll lipid profile  No change in management  Hyperlipidemia:Low fat diet discussed and encouraged.

## 2013-09-12 NOTE — Assessment & Plan Note (Signed)
Controlled, no change in medication  

## 2013-09-12 NOTE — Progress Notes (Signed)
   Subjective:    Patient ID: Gina Black, female    DOB: Nov 25, 1955, 58 y.o.   MRN: [id number]  HPI The PT is here for follow up and re-evaluation of chronic medical conditions, medication management and review of any available recent lab and radiology data.  Preventive health is updated, specifically  Cancer screening and Immunization.  Needs mammogram and daughter will get this arranged  The PT denies any adverse reactions to current medications since the last visit. She is now responsibly taking medications prescribed , which is only 1 There are no new concerns.  There are no specific complaints  Daughter provides report  Of improvement in her overall level of functiioning      Review of Systems See HPI Denies recent fever or chills. Denies sinus pressure, nasal congestion, ear pain or sore throat. Denies chest congestion, productive cough or wheezing. Denies chest pains, palpitations and leg swelling Denies abdominal pain, nausea, vomiting,diarrhea or constipation.   Denies dysuria, frequency, hesitancy or incontinence. Denies joint pain, swelling and limitation in mobility. Denies headaches, seizures, numbness, or tingling. Denies depression, anxiety or insomnia. Denies skin break down or rash.        Objective:   Physical Exam BP 140/76  Pulse 80  Resp 18  Ht 5\' 7"  (1.702 m)  Wt 139 lb 1.3 oz (63.086 kg)  BMI 21.78 kg/m2  SpO2 92% Patient alert and  in no cardiopulmonary distress.  HEENT: No facial asymmetry, EOMI,   oropharynx pink and moist.  Neck supple no JVD, no mass.  Chest: Clear to auscultation bilaterally.  CVS: S1, S2 no murmurs, no S3.Regular rate.  ABD: Soft non tender.   Ext: No edema  MS: Adequate ROM spine, shoulders, hips and knees.  Skin: Intact, no ulcerations or rash noted.  Psych: Good eye contact, . Not anxious or depressed appearing.  CNS: CN 2-12 intact, power,  normal throughout.no focal deficits noted.          Assessment & Plan:  HYPERTENSION Controlled, no change in medication   Need for vaccination with 13-polyvalent pneumococcal conjugate vaccine vaccxine administered  Psychotic disorder with delusions Currently stable with improved level of functioning in past several months per daughter's report  Dyslipidemia Improved, on no medication, with excellent overa;ll lipid profile  No change in management  Hyperlipidemia:Low fat diet discussed and encouraged.

## 2013-09-12 NOTE — Assessment & Plan Note (Signed)
Currently stable with improved level of functioning in past several months per daughter's report

## 2013-09-12 NOTE — Assessment & Plan Note (Signed)
vaccxine administered

## 2013-09-12 NOTE — Patient Instructions (Signed)
F/u in early January, call if you need me before.  Prvenar today.    Come for flu vaccine as arranged please  Excellent report , re lab and blood pressure, keep up the GREAT  Work    Pls sched and get mammogram as soon as possible,  I hope you continue to very well, I believe that you will

## 2013-09-13 NOTE — Addendum Note (Signed)
Addended by: Denman George B on: 09/13/2013 09:09 AM   Modules accepted: Orders

## 2013-10-04 ENCOUNTER — Ambulatory Visit: Payer: Medicare Other | Admitting: Family Medicine

## 2013-10-05 ENCOUNTER — Ambulatory Visit: Payer: Medicare Other | Admitting: Family Medicine

## 2013-10-05 ENCOUNTER — Telehealth: Payer: Self-pay | Admitting: Family Medicine

## 2013-10-05 NOTE — Telephone Encounter (Signed)
Called patient and left message for them to return call at the office   

## 2013-10-06 ENCOUNTER — Emergency Department (HOSPITAL_COMMUNITY)
Admission: EM | Admit: 2013-10-06 | Discharge: 2013-10-06 | Disposition: A | Discharge status: Home / Self Care | Payer: Medicare Other | Attending: Emergency Medicine | Admitting: Emergency Medicine

## 2013-10-06 ENCOUNTER — Encounter (HOSPITAL_COMMUNITY): Payer: Self-pay | Admitting: Emergency Medicine

## 2013-10-06 DIAGNOSIS — K120 Recurrent oral aphthae: Secondary | ICD-10-CM | POA: Diagnosis not present

## 2013-10-06 DIAGNOSIS — Z8639 Personal history of other endocrine, nutritional and metabolic disease: Secondary | ICD-10-CM | POA: Insufficient documentation

## 2013-10-06 DIAGNOSIS — I10 Essential (primary) hypertension: Secondary | ICD-10-CM | POA: Diagnosis not present

## 2013-10-06 DIAGNOSIS — K1370 Unspecified lesions of oral mucosa: Secondary | ICD-10-CM | POA: Insufficient documentation

## 2013-10-06 DIAGNOSIS — Z87891 Personal history of nicotine dependence: Secondary | ICD-10-CM | POA: Insufficient documentation

## 2013-10-06 DIAGNOSIS — Z7982 Long term (current) use of aspirin: Secondary | ICD-10-CM | POA: Insufficient documentation

## 2013-10-06 DIAGNOSIS — Z79899 Other long term (current) drug therapy: Secondary | ICD-10-CM | POA: Diagnosis not present

## 2013-10-06 DIAGNOSIS — Z8659 Personal history of other mental and behavioral disorders: Secondary | ICD-10-CM | POA: Diagnosis not present

## 2013-10-06 DIAGNOSIS — Z862 Personal history of diseases of the blood and blood-forming organs and certain disorders involving the immune mechanism: Secondary | ICD-10-CM | POA: Insufficient documentation

## 2013-10-06 MED ORDER — MAGIC MOUTHWASH W/LIDOCAINE
5.0000 mL | Freq: Three times a day (TID) | ORAL | Status: DC
Start: 2013-10-06 — End: 2013-11-16

## 2013-10-06 NOTE — ED Provider Notes (Signed)
CSN: [id number]     Arrival date & time 10/06/13  1438 History   First MD Initiated Contact with Patient 10/06/13 1612     Chief Complaint  Patient presents with  . Mouth Lesions     (Consider location/radiation/quality/duration/timing/severity/associated sxs/prior Treatment) Patient is a 58 y.o. female presenting with mouth sores. The history is provided by the patient.  Mouth Lesions  Gina Black is a 57 y.o. female who presents to the ED with a tender ulcer area to the left side of her tongue. The area started a few days ago. She denies any other problems.  Past Medical History  Diagnosis Date  . Psychotic disorder August 2009    Hopitalised Involuntary commited by daughter   . Hypertension   . Hyperlipidemia   . S/P colonoscopy 03/20/2008    Dr Oneida Alar normal, except hemorrhoids  . Hemorrhoids    Past Surgical History  Procedure Laterality Date  . Cholecystectomy  2001  . Ectopic pregnancy surgery  1986    right    Family History  Problem Relation Age of Onset  . Hypertension Mother   . Diabetes Mother   . Kidney disease Mother   . Diabetes Father    History  Substance Use Topics  . Smoking status: Former Smoker -- 1.00 packs/day for 6 years    Types: Cigarettes    Quit date: 04/28/2010  . Smokeless tobacco: Never Used  . Alcohol Use: No   OB History   Grav Para Term Preterm Abortions TAB SAB Ect Mult Living                 Review of Systems  HENT: Positive for mouth sores.   all other systems negative    Allergies  Ciprofloxacin and Prednisone  Home Medications   Prior to Admission medications   Medication Sig Start Date End Date Taking? Authorizing Provider  aspirin EC 81 MG tablet Take 1 tablet (81 mg total) by mouth daily. 03/21/13   Fayrene Helper, MD  calcium-vitamin D (OSCAL 500/200 D-3) 500 MG tablet Take 1 tablet by mouth 2 (two) times daily.      Historical Provider, MD  Multiple Vitamins-Minerals (MULTIVITAMIN WITH IRON-MINERALS)  liquid Take by mouth daily.      Historical Provider, MD  triamterene-hydrochlorothiazide (MAXZIDE-25) 37.5-25 MG per tablet TAKE ONE TABLET BY MOUTH DAILY. 08/29/13   Fayrene Helper, MD   BP 134/77  Pulse 77  Temp(Src) 98.4 F (36.9 C) (Oral)  Resp 16  Ht 5\' 7"  (1.702 m)  Wt 139 lb (63.05 kg)  BMI 21.77 kg/m2  SpO2 100% Physical Exam  Nursing note and vitals reviewed. Constitutional: She is oriented to person, place, and time. She appears well-developed and well-nourished.  HENT:  Head: Normocephalic.  Nose: Nose normal.  Mouth/Throat: Uvula is midline, oropharynx is clear and moist and mucous membranes are normal.    There is a small ulcer area noted to the left side of the tongue. It is tender on exam.   Eyes: Conjunctivae and EOM are normal.  Neck: Neck supple.  Cardiovascular: Normal rate.   Pulmonary/Chest: Effort normal.  Musculoskeletal: Normal range of motion.  Lymphadenopathy:    She has no cervical adenopathy.  Neurological: She is alert and oriented to person, place, and time. No cranial nerve deficit.  Skin: Skin is warm and dry.  Psychiatric: She has a normal mood and affect.    ED Course  Procedures (  MDM  58 y.o. female with oral  ulcer. Will treat with magic mouth wash and she will take tylenol as needed for pain. She will follow up with her PCP or return here as needed. Stable for discharge without other problems today.    [place], [place] 10/08/13 [phone]

## 2013-10-06 NOTE — Discharge Instructions (Signed)
Oral Ulcers Oral ulcers are painful, shallow sores around the lining of the mouth. They can affect the gums, the inside of the lips, and the cheeks. (Sores on the outside of the lips and on the face are different.) They typically first occur in school-aged children and teenagers. Oral ulcers may also be called canker sores or cold sores. CAUSES  Canker sores and cold sores can be caused by many factors including:  Infection.  Injury.  Sun exposure.  Medications.  Emotional stress.  Food allergies.  Vitamin deficiencies.  Toothpastes containing sodium lauryl sulfate. The herpes virus can be the cause of mouth ulcers. The first infection can be severe and cause 10 or more ulcers on the gums, tongue, and lips with fever and difficulty in swallowing. This infection usually occurs between the ages of 1 and 3 years.  SYMPTOMS  The typical sore is about  inch (6 mm) in size and is an oval or round ulcer with red borders. DIAGNOSIS  Your caregiver can diagnose simple oral ulcers by examination. Additional testing is usually not required.  TREATMENT  Treatment is aimed at pain relief. Generally, oral ulcers resolve by themselves within 1 to 2 weeks without medication and are not contagious unless caused by herpes (and other viruses). Antibiotics are not effective with mouth sores. Avoid direct contact with others until the ulcer is completely healed. See your caregiver for follow-up care as recommended. Also:  Offer a soft diet.  Encourage plenty of fluids to prevent dehydration. Popsicles and milk shakes can be helpful.  Avoid acidic and salty foods and drinks such as orange juice.  Infants and young children will often refuse to drink because of pain. Using a teaspoon, cup, or syringe to give small amounts of fluids frequently can help prevent dehydration.  Cold compresses on the face may help reduce pain.  Pain medication can help control soreness.  A solution of diphenhydramine  mixed with a liquid antacid can be useful to decrease the soreness of ulcers. Consult a caregiver for the dosing.  Liquids or ointments with a numbing ingredient may be helpful when used as recommended.  Older children and teenagers can rinse their mouth with a salt-water mixture (1/2 teaspoon of salt in 8 ounces of water) four times a day. This treatment is uncomfortable but may reduce the time the ulcers are present.  There are many over-the-counter throat lozenges and medications available for oral ulcers. Their effectiveness has not been studied.  Consult your medical caregiver prior to using homeopathic treatments for oral ulcers. SEEK MEDICAL CARE IF:   You think your child needs to be seen.  The pain worsens and you cannot control it.  There are 4 or more ulcers.  The lips and gums begin to bleed and crust.  A single mouth ulcer is near a tooth that is causing a toothache or pain.  Your child has a fever, swollen face, or swollen glands.  The ulcers began after starting a medication.  Mouth ulcers keep reoccurring or last more than 2 weeks.  You think your child is not taking adequate fluids. SEEK IMMEDIATE MEDICAL CARE IF:   Your child has a high fever.  Your child is unable to swallow or becomes dehydrated.  Your child looks or acts very ill.  An ulcer caused by a chemical your child accidentally put in their mouth. Document Released: 03/04/2004 Document Revised: 06/11/2013 Document Reviewed: 10/17/2008 Exit[employer] Patient Information 2015 [employer]. This information is not intended to replace advice   given to you by your health care provider. Make sure you discuss any questions you have with your health care provider.  

## 2013-10-06 NOTE — ED Notes (Addendum)
Pt reports noted spot on left side of tongue. nad noted. Airway patent. Pt reports intermittent fevers. Pt afebrile in triage.

## 2013-10-06 NOTE — ED Notes (Signed)
Patient with no complaints at this time. Respirations even and unlabored. Skin warm/dry. Discharge instructions reviewed with patient at this time. Patient given opportunity to voice concerns/ask questions. Patient discharged at this time and left Emergency Department with steady gait.   

## 2013-10-06 NOTE — ED Notes (Signed)
Patient has been rinsing oral cavity w/peroxide for sore spot on tongue.  States she has had fevers, but daughter states she has not.

## 2013-10-07 ENCOUNTER — Encounter: Payer: Self-pay | Admitting: Family Medicine

## 2013-10-08 NOTE — Telephone Encounter (Signed)
Patient sent patient advise request explaining further and I sent it to the Dr

## 2013-10-09 NOTE — ED Provider Notes (Signed)
Medical screening examination/treatment/procedure(s) were performed by non-physician practitioner and as supervising physician I was immediately available for consultation/collaboration.   EKG Interpretation None        Maudry Diego, MD 10/09/13 [phone]

## 2013-10-19 ENCOUNTER — Other Ambulatory Visit: Payer: Self-pay | Admitting: Family Medicine

## 2013-10-19 DIAGNOSIS — Z1231 Encounter for screening mammogram for malignant neoplasm of breast: Secondary | ICD-10-CM

## 2013-10-29 ENCOUNTER — Ambulatory Visit (HOSPITAL_COMMUNITY)
Admission: RE | Admit: 2013-10-29 | Discharge: 2013-10-29 | Disposition: A | Discharge status: Home / Self Care | Payer: Medicare Other | Source: Ambulatory Visit | Attending: Family Medicine | Admitting: Family Medicine

## 2013-10-29 DIAGNOSIS — Z1231 Encounter for screening mammogram for malignant neoplasm of breast: Secondary | ICD-10-CM | POA: Diagnosis not present

## 2013-11-15 ENCOUNTER — Telehealth: Payer: Self-pay | Admitting: *Deleted

## 2013-11-15 NOTE — Telephone Encounter (Signed)
Ok to refill x 1, let her know

## 2013-11-15 NOTE — Telephone Encounter (Signed)
Pt called stating she was seen in the ER by Dr. Roderic Palau on 10/06/13, Dr. Roderic Palau gave her a RX of dukes matic 60mg . Pt is requesting Dr. Moshe Cipro write her a rx. Please advise [phone] pt uses [facility].

## 2013-11-15 NOTE — Telephone Encounter (Signed)
Wants a refill of the mouthwash that was given in the ER. Please advise

## 2013-11-16 ENCOUNTER — Other Ambulatory Visit: Payer: Self-pay

## 2013-11-16 MED ORDER — MAGIC MOUTHWASH W/LIDOCAINE: 5.0000 mL | Freq: Three times a day (TID) | ORAL | Status: DC

## 2013-11-16 NOTE — Telephone Encounter (Signed)
Med sent.

## 2013-11-19 ENCOUNTER — Ambulatory Visit (INDEPENDENT_AMBULATORY_CARE_PROVIDER_SITE_OTHER): Payer: Medicare Other

## 2013-11-19 DIAGNOSIS — Z23 Encounter for immunization: Secondary | ICD-10-CM

## 2013-12-19 ENCOUNTER — Encounter: Payer: Self-pay | Admitting: Family Medicine

## 2013-12-20 ENCOUNTER — Encounter: Payer: Self-pay | Admitting: Family Medicine

## 2013-12-20 ENCOUNTER — Ambulatory Visit (INDEPENDENT_AMBULATORY_CARE_PROVIDER_SITE_OTHER): Payer: Medicare Other | Admitting: Family Medicine

## 2013-12-20 VITALS — BP 106/62 | HR 78 | Resp 18 | Ht 67.0 in | Wt 144.0 lb

## 2013-12-20 DIAGNOSIS — K1379 Other lesions of oral mucosa: Secondary | ICD-10-CM

## 2013-12-20 DIAGNOSIS — F062 Psychotic disorder with delusions due to known physiological condition: Secondary | ICD-10-CM | POA: Diagnosis not present

## 2013-12-20 DIAGNOSIS — I10 Essential (primary) hypertension: Secondary | ICD-10-CM

## 2013-12-20 DIAGNOSIS — F29 Unspecified psychosis not due to a substance or known physiological condition: Secondary | ICD-10-CM

## 2013-12-20 MED ORDER — MAGIC MOUTHWASH W/LIDOCAINE: 5.0000 mL | Freq: Three times a day (TID) | ORAL | Status: DC

## 2013-12-20 NOTE — Progress Notes (Signed)
   Subjective:    Patient ID: Gina Black, female    DOB: January 02, 1956, 58 y.o.   MRN: [id number]  HPI 2day h/o "sores in my mouth"Denies fever, chills, trauma to gum or tongue, states she has had this in the past and mouthwash helped. No recall of new foods, states she is looking forward when she will be able to have a big meal with her daughter. States generally doing well, looks comfortable and is smiling with the young lady and her 54 y/o daughter who brought Gina Black in on behalf of her daughter   Review of Systems See HPI Denies recent fever or chills. Denies sinus pressure, nasal congestion, ear pain or sore throat. Denies chest congestion, productive cough or wheezing. Denies chest pains, palpitations and leg swelling Denies abdominal pain, nausea, vomiting,diarrhea or constipation.   Denies dysuria, frequency, hesitancy or incontinence. Denies joint pain, swelling and limitation in mobility. Denies headaches, seizures, numbness, or tingling. Denies depression, anxiety or insomnia. Denies skin break down or rash.        Objective:   Physical Exam BP 106/62 mmHg  Pulse 78  Resp 18  Ht 5\' 7"  (1.702 m)  Wt 144 lb 0.6 oz (65.336 kg)  BMI 22.55 kg/m2  SpO2 96% Patient alert and oriented and in no cardiopulmonary distress.Pt sitting up laughing and talking comfortably with the people who accompany her  HEENT: No facial asymmetry, EOMI,   oropharynx pink and moist. No ulcers noted on left  Gum or on left side of tongue which is the area of pt's concern, entire gum examined and no ulcers seen  Neck supple no JVD, no mass.  Chest: Clear to auscultation bilaterally.  CVS: S1, S2 no murmurs, no S3.Regular rate.  ABD: Soft non tender.   Ext: No edema  MS: Adequate ROM spine, shoulders, hips and knees.  Skin: Intact, no ulcerations or rash noted.  Psych: Good eye contact, normal affect. Memory impaired not anxious or depressed appearing.  CNS: CN 2-12 intact, power,   normal throughout.no focal deficits noted.        Assessment & Plan:  Oral pain of unknown etiology Oral exam is negative for ulcers, dental health is fair, no cause of reported pain on exam. Psychological underlay a strong possibility  mouthwash 3 times daily as needed, for discomfort for 10 days maximum  Psychotic disorder with delusions Unchanged, pt non compliant with prescription meds, no potentially harmful behavior to heself or to anyone Caregiver is her only daughter/child  Essential hypertension Controlled, no change in medication

## 2013-12-20 NOTE — Patient Instructions (Addendum)
F/u as before  Exam of your mouth shows no ulcers in the area of concern.  Do not brush tongue too hard as this will cause pain and irritation  Mouthwash prescribed for numbing is being sent today, but you should not need to be using this repeatedly, just do not brush excessively hard please

## 2013-12-22 DIAGNOSIS — K1379 Other lesions of oral mucosa: Secondary | ICD-10-CM | POA: Insufficient documentation

## 2013-12-22 NOTE — Assessment & Plan Note (Addendum)
Oral exam is negative for ulcers, dental health is fair, no cause of reported pain on exam. Psychological underlay a strong possibility  mouthwash 3 times daily as needed, for discomfort for 10 days maximum

## 2013-12-22 NOTE — Assessment & Plan Note (Signed)
Controlled, no change in medication  

## 2013-12-22 NOTE — Assessment & Plan Note (Signed)
Unchanged, pt non compliant with prescription meds, no potentially harmful behavior to heself or to anyone Caregiver is her only daughter/child

## 2013-12-24 ENCOUNTER — Ambulatory Visit: Payer: Medicare Other | Admitting: Family Medicine

## 2014-01-18 ENCOUNTER — Other Ambulatory Visit: Payer: Self-pay | Admitting: Family Medicine

## 2014-05-09 ENCOUNTER — Ambulatory Visit (INDEPENDENT_AMBULATORY_CARE_PROVIDER_SITE_OTHER): Payer: Medicare Other | Admitting: Family Medicine

## 2014-05-09 ENCOUNTER — Encounter: Payer: Self-pay | Admitting: Family Medicine

## 2014-05-09 VITALS — BP 118/74 | HR 67 | Resp 16 | Ht 67.0 in | Wt 152.0 lb

## 2014-05-09 DIAGNOSIS — E559 Vitamin D deficiency, unspecified: Secondary | ICD-10-CM

## 2014-05-09 DIAGNOSIS — I10 Essential (primary) hypertension: Secondary | ICD-10-CM

## 2014-05-09 DIAGNOSIS — N3000 Acute cystitis without hematuria: Secondary | ICD-10-CM | POA: Diagnosis not present

## 2014-05-09 DIAGNOSIS — F29 Unspecified psychosis not due to a substance or known physiological condition: Secondary | ICD-10-CM

## 2014-05-09 DIAGNOSIS — N3001 Acute cystitis with hematuria: Secondary | ICD-10-CM | POA: Diagnosis not present

## 2014-05-09 DIAGNOSIS — E785 Hyperlipidemia, unspecified: Secondary | ICD-10-CM

## 2014-05-09 DIAGNOSIS — F062 Psychotic disorder with delusions due to known physiological condition: Secondary | ICD-10-CM

## 2014-05-09 LAB — POCT URINALYSIS DIPSTICK
BILIRUBIN UA: NEGATIVE
Glucose, UA: NEGATIVE
Ketones, UA: NEGATIVE
Nitrite, UA: NEGATIVE
PH UA: 7
Protein, UA: NEGATIVE
Spec Grav, UA: 1.015
Urobilinogen, UA: 0.2

## 2014-05-09 NOTE — Patient Instructions (Signed)
Annual wellness in 4.5 month   Call if you need me before   Fasting lipid, chem 7 , CBc and TSH and vit D August 4 or after

## 2014-05-10 ENCOUNTER — Other Ambulatory Visit: Payer: Self-pay | Admitting: Family Medicine

## 2014-05-11 ENCOUNTER — Encounter: Payer: Self-pay | Admitting: Family Medicine

## 2014-05-11 LAB — URINE CULTURE

## 2014-06-23 NOTE — Assessment & Plan Note (Signed)
Stable, despite not being on any medication

## 2014-06-23 NOTE — Progress Notes (Signed)
   Subjective:    Patient ID: Gina Black, female    DOB: 05-Sep-1955, 59 y.o.   MRN: [id number]  HPI The PT is here for follow up and re-evaluation of chronic medical conditions, medication management and review of any available recent lab and radiology data.  Preventive health is updated, specifically  Cancer screening and Immunization.   . The PT denies any adverse reactions to current medications since the last visit.  There are no new concerns.  There are no specific complaints       Review of Systems See HPI Denies recent fever or chills. Denies sinus pressure, nasal congestion, ear pain or sore throat. Denies chest congestion, productive cough or wheezing. Denies chest pains, palpitations and leg swelling Denies abdominal pain, nausea, vomiting,diarrhea or constipation.   Denies dysuria, frequency, hesitancy or incontinence. Denies joint pain, swelling and limitation in mobility. Denies headaches, seizures, numbness, or tingling. Denies depression, anxiety or insomnia. Denies skin break down or rash.        Objective:   Physical Exam BP 118/74 mmHg  Pulse 67  Resp 16  Ht 5\' 7"  (1.702 m)  Wt 152 lb (68.947 kg)  BMI 23.80 kg/m2  SpO2 99% Patient alert and oriented and in no cardiopulmonary distress.  HEENT: No facial asymmetry, EOMI,   oropharynx pink and moist.  Neck supple no JVD, no mass.  Chest: Clear to auscultation bilaterally.  CVS: S1, S2 no murmurs, no S3.Regular rate.  ABD: Soft non tender.   Ext: No edema  MS: Adequate ROM spine, shoulders, hips and knees.  Skin: Intact, no ulcerations or rash noted.  Psych: Good eye contact, normal affect. Memory intact not anxious or depressed appearing.  CNS: CN 2-12 intact, power,  normal throughout.no focal deficits noted.        Assessment & Plan:  Essential hypertension Controlled, no change in medication DASH diet and commitment to daily physical activity for a minimum of 30 minutes  discussed and encouraged, as a part of hypertension management. The importance of attaining a healthy weight is also discussed.  BP/Weight 05/09/2014 12/20/2013 10/06/2013 09/12/2013 03/21/2013 36/14/4315 4/0/0867  Systolic BP 619 509 326 712 458 099 833  Diastolic BP 74 62 77 76 80 82 72  Wt. (Lbs) 152 144.04 139 139.08 138 139.12 135.08  BMI 23.8 22.55 21.77 21.78 21.61 21.78 21.15         Psychotic disorder with delusions Stable, despite not being on any medication

## 2014-06-23 NOTE — Assessment & Plan Note (Signed)
Controlled, no change in medication DASH diet and commitment to daily physical activity for a minimum of 30 minutes discussed and encouraged, as a part of hypertension management. The importance of attaining a healthy weight is also discussed.  BP/Weight 05/09/2014 12/20/2013 10/06/2013 09/12/2013 03/21/2013 91/79/1505 07/18/7946  Systolic BP 016 553 748 270 786 754 492  Diastolic BP 74 62 77 76 80 82 72  Wt. (Lbs) 152 144.04 139 139.08 138 139.12 135.08  BMI 23.8 22.55 21.77 21.78 21.61 21.78 21.15

## 2014-07-25 ENCOUNTER — Other Ambulatory Visit: Payer: Self-pay

## 2014-07-25 MED ORDER — CENTRUM PO LIQD: 5.0000 mL | Freq: Every day | ORAL | Status: DC

## 2014-07-25 MED ORDER — POTASSIUM CHLORIDE CRYS ER 10 MEQ PO TBCR
10.0000 meq | EXTENDED_RELEASE_TABLET | Freq: Two times a day (BID) | ORAL | Status: DC
Start: 2014-07-25 — End: 2016-06-15

## 2014-07-29 ENCOUNTER — Telehealth: Payer: Self-pay | Admitting: Family Medicine

## 2014-07-29 ENCOUNTER — Ambulatory Visit (INDEPENDENT_AMBULATORY_CARE_PROVIDER_SITE_OTHER): Payer: Medicare Other

## 2014-07-29 DIAGNOSIS — N3001 Acute cystitis with hematuria: Secondary | ICD-10-CM

## 2014-07-29 DIAGNOSIS — N3000 Acute cystitis without hematuria: Secondary | ICD-10-CM | POA: Diagnosis not present

## 2014-07-29 LAB — POCT URINALYSIS DIPSTICK
BILIRUBIN UA: NEGATIVE
Glucose, UA: NEGATIVE
KETONES UA: NEGATIVE
Nitrite, UA: NEGATIVE
Protein, UA: 30
Spec Grav, UA: 1.02
Urobilinogen, UA: 0.2
pH, UA: 7

## 2014-07-29 MED ORDER — CIPROFLOXACIN HCL 500 MG PO TABS: 500.0000 mg | ORAL_TABLET | Freq: Two times a day (BID) | ORAL | Status: DC

## 2014-07-29 NOTE — Telephone Encounter (Signed)
This is not done on urine but on labwork- all her glucose labs have been in range. Will call back and let her know

## 2014-07-29 NOTE — Progress Notes (Signed)
Patient in complaining of pressure, burning, and urinary frequency.  Denies any fever.  Urine checked and positive for UTI.  Urine sent for culture.  Patient started on cipro prophylactic.

## 2014-07-30 NOTE — Telephone Encounter (Signed)
Called and left message notifying patient that this test is not recommended for her

## 2014-08-01 LAB — URINE CULTURE: Colony Count: >=100000

## 2014-08-02 ENCOUNTER — Telehealth: Payer: Self-pay

## 2014-08-02 ENCOUNTER — Other Ambulatory Visit: Payer: Self-pay | Admitting: Family Medicine

## 2014-08-02 MED ORDER — AMPICILLIN 500 MG PO CAPS: 500.0000 mg | ORAL_CAPSULE | Freq: Three times a day (TID) | ORAL | Status: DC

## 2014-08-02 NOTE — Telephone Encounter (Signed)
Yes she does, pls let her know 3 day course of ampicillin has been sent to [facility]

## 2014-08-05 NOTE — Telephone Encounter (Signed)
Called and left message for patient to return call.  

## 2014-08-06 ENCOUNTER — Telehealth: Payer: Self-pay | Admitting: Family Medicine

## 2014-08-06 MED ORDER — SULFAMETHOXAZOLE-TRIMETHOPRIM 800-160 MG PO TABS: 1.0000 | ORAL_TABLET | Freq: Two times a day (BID) | ORAL | Status: DC

## 2014-08-06 NOTE — Telephone Encounter (Signed)
[  pt's pharmacy reports PCN allergy, 3 days of septra is sent in for her uTI pls let pt and pharmacy know,I will add PCN to our list of stated allergies, thanks

## 2014-08-06 NOTE — Telephone Encounter (Signed)
Called and left message for patient to return call.  

## 2014-08-07 ENCOUNTER — Telehealth: Payer: Self-pay

## 2014-08-07 NOTE — Telephone Encounter (Signed)
Noted.  Called and spoke with pharmacy and they state that patient says the reaction is a rash.  New rx faxed to pharmacy.

## 2014-08-07 NOTE — Telephone Encounter (Signed)
See next telephone message. 

## 2014-08-07 NOTE — Telephone Encounter (Signed)
Even if still having symptoms, she will only need to get urine c/s repeated before any other antibiotic ias multiple med allergies reported are a HUGE problem  Let her know many infections clear with good water intake and encourage this

## 2014-09-03 ENCOUNTER — Other Ambulatory Visit: Payer: Self-pay | Admitting: Family Medicine

## 2014-10-21 ENCOUNTER — Encounter: Payer: Medicare Other | Admitting: Family Medicine

## 2014-10-21 ENCOUNTER — Encounter: Payer: Self-pay | Admitting: *Deleted

## 2014-10-29 ENCOUNTER — Other Ambulatory Visit: Payer: Self-pay | Admitting: Family Medicine

## 2014-10-29 ENCOUNTER — Other Ambulatory Visit: Payer: Self-pay

## 2014-10-29 ENCOUNTER — Telehealth: Payer: Self-pay | Admitting: *Deleted

## 2014-10-29 MED ORDER — MAGIC MOUTHWASH W/LIDOCAINE: 5.0000 mL | Freq: Three times a day (TID) | ORAL | Status: DC

## 2014-10-29 NOTE — Telephone Encounter (Signed)
States she has the sore places of her tongue again and is requesting the mouthwash that was sent in last time. Please advise

## 2014-10-29 NOTE — Telephone Encounter (Signed)
Ok to send in 60 ml of mouthwash as befiore x 1, let her know missed appt needs to resched and needs flu vac pls

## 2014-10-29 NOTE — Telephone Encounter (Signed)
Patient called [facility] requesting medication for the place in her mouth that is sore. Please advise

## 2014-10-29 NOTE — Telephone Encounter (Signed)
Med called in and tried to call pt and no answer

## 2015-01-16 ENCOUNTER — Ambulatory Visit (INDEPENDENT_AMBULATORY_CARE_PROVIDER_SITE_OTHER): Payer: Medicare Other

## 2015-01-16 DIAGNOSIS — Z23 Encounter for immunization: Secondary | ICD-10-CM | POA: Diagnosis not present

## 2015-05-30 ENCOUNTER — Telehealth: Payer: Self-pay | Admitting: Family Medicine

## 2015-05-30 NOTE — Telephone Encounter (Signed)
Patient is calling stating she needs something to be called in to the pharmacy for vaginal rash and itching, please advise?

## 2015-06-02 NOTE — Telephone Encounter (Signed)
Advised that Dr is on vacation this week but to call back and I could recommend something otc

## 2015-06-18 ENCOUNTER — Telehealth: Payer: Self-pay | Admitting: Family Medicine

## 2015-06-18 DIAGNOSIS — Z1159 Encounter for screening for other viral diseases: Secondary | ICD-10-CM

## 2015-06-18 DIAGNOSIS — E559 Vitamin D deficiency, unspecified: Secondary | ICD-10-CM

## 2015-06-18 DIAGNOSIS — I10 Essential (primary) hypertension: Secondary | ICD-10-CM

## 2015-06-18 DIAGNOSIS — E785 Hyperlipidemia, unspecified: Secondary | ICD-10-CM

## 2015-06-18 NOTE — Telephone Encounter (Signed)
daughter is coming in Friday to pick up lab orders but I wasn't sure which ones to print or if they were the March ones

## 2015-06-18 NOTE — Telephone Encounter (Signed)
Lab order up front to be collected

## 2015-06-20 DIAGNOSIS — Z0289 Encounter for other administrative examinations: Secondary | ICD-10-CM

## 2015-06-21 DIAGNOSIS — E559 Vitamin D deficiency, unspecified: Secondary | ICD-10-CM | POA: Diagnosis not present

## 2015-06-21 DIAGNOSIS — E785 Hyperlipidemia, unspecified: Secondary | ICD-10-CM | POA: Diagnosis not present

## 2015-06-21 DIAGNOSIS — Z1159 Encounter for screening for other viral diseases: Secondary | ICD-10-CM | POA: Diagnosis not present

## 2015-06-21 DIAGNOSIS — I10 Essential (primary) hypertension: Secondary | ICD-10-CM | POA: Diagnosis not present

## 2015-06-21 LAB — BASIC METABOLIC PANEL
BUN: 15 mg/dL (ref 7–25)
CALCIUM: 9.4 mg/dL (ref 8.6–10.4)
CO2: 29 mmol/L (ref 20–31)
CREATININE: 0.83 mg/dL (ref 0.50–0.99)
Chloride: 104 mmol/L (ref 98–110)
Glucose, Bld: 86 mg/dL (ref 65–99)
Potassium: 3.6 mmol/L (ref 3.5–5.3)
Sodium: 142 mmol/L (ref 135–146)

## 2015-06-21 LAB — LIPID PANEL
CHOLESTEROL: 201 mg/dL — AB (ref 125–200)
HDL: 78 mg/dL (ref >=46)
LDL Cholesterol: 114 mg/dL (ref <130)
Total CHOL/HDL Ratio: 2.6 Ratio (ref <=5.0)
Triglycerides: 44 mg/dL (ref <150)
VLDL: 9 mg/dL (ref <30)

## 2015-06-21 LAB — HIV ANTIBODY (ROUTINE TESTING W REFLEX): HIV 1&2 Ab, 4th Generation: NONREACTIVE

## 2015-06-21 LAB — HEPATITIS C ANTIBODY: HCV AB: NEGATIVE

## 2015-06-21 LAB — TSH: TSH: 1.38 m[IU]/L

## 2015-06-23 LAB — VITAMIN D 25 HYDROXY (VIT D DEFICIENCY, FRACTURES): VIT D 25 HYDROXY: 31 ng/mL (ref 30–100)

## 2015-06-24 ENCOUNTER — Encounter: Payer: Self-pay | Admitting: Family Medicine

## 2015-08-12 ENCOUNTER — Encounter: Payer: Self-pay | Admitting: Family Medicine

## 2015-08-19 ENCOUNTER — Ambulatory Visit (INDEPENDENT_AMBULATORY_CARE_PROVIDER_SITE_OTHER): Payer: Medicare Other | Admitting: Family Medicine

## 2015-08-19 ENCOUNTER — Encounter: Payer: Self-pay | Admitting: Family Medicine

## 2015-08-19 VITALS — BP 158/88 | HR 76 | Resp 16 | Ht 67.0 in | Wt 165.0 lb

## 2015-08-19 DIAGNOSIS — Z0000 Encounter for general adult medical examination without abnormal findings: Secondary | ICD-10-CM | POA: Diagnosis not present

## 2015-08-19 DIAGNOSIS — F062 Psychotic disorder with delusions due to known physiological condition: Secondary | ICD-10-CM

## 2015-08-19 DIAGNOSIS — H547 Unspecified visual loss: Secondary | ICD-10-CM | POA: Diagnosis not present

## 2015-08-19 DIAGNOSIS — Z1231 Encounter for screening mammogram for malignant neoplasm of breast: Secondary | ICD-10-CM

## 2015-08-19 DIAGNOSIS — F29 Unspecified psychosis not due to a substance or known physiological condition: Secondary | ICD-10-CM

## 2015-08-19 DIAGNOSIS — I10 Essential (primary) hypertension: Secondary | ICD-10-CM | POA: Diagnosis not present

## 2015-08-19 MED ORDER — TRIAMTERENE-HCTZ 37.5-25 MG PO TABS: 1.0000 | ORAL_TABLET | Freq: Every day | ORAL | Status: DC

## 2015-08-19 NOTE — Patient Instructions (Addendum)
F/u with pelvic and breast in 4 months, call if you need me sooner  PLEASE cut back on cake , cookies and chips  NEED to take BP medication every dday   You are being referred fro eye exam to Dr Lesia Hausen are being referred for a mammogram  Thank you  for choosing [facility]. We consider it a privelige to serve you.  Delivering excellent health care in a caring and  compassionate way is our goal.  Partnering with you,  so that together we can achieve this goal is our strategy.

## 2015-08-19 NOTE — Progress Notes (Signed)
Preventive Screening-Counseling & Management   Patient present here today for a Medicare annual wellness visit.   Current Problems (verified)   Medications Prior to Visit Allergies (verified)   PAST HISTORY  Family History (verified)   Social History Widowed, 1 daughter, disabled due to mental health. Lives at home alone. Former smoker    Risk Factors  Current exercise habits:  Does exercises at home a few days a week   Dietary issues discussed: heart healthy diet encouraged and limits fried fatty foods    Cardiac risk factors: none  Depression Screen  (Note: if answer to either of the following is "Yes", a more complete depression screening is indicated)   Over the past two weeks, have you felt down, depressed or hopeless? No  Over the past two weeks, have you felt little interest or pleasure in doing things? No  Have you lost interest or pleasure in daily life? No  Do you often feel hopeless? No  Do you cry easily over simple problems? No   Activities of Daily Living  In your present state of health, do you have any difficulty performing the following activities?  Driving?: doesn't drive  Managing money?: daughter handles finances Feeding yourself?:No Getting from bed to chair?:No Climbing a flight of stairs?:No Preparing food and eating?: sometimes prepares own food, sometimes her daughter  Bathing or showering?:No Getting dressed?:No Getting to the toilet?:No Using the toilet?:No Moving around from place to place?: No  Fall Risk Assessment In the past year have you fallen or had a near fall?:No Are you currently taking any medications that make you dizzy?:No   Hearing Difficulties: No Do you often ask people to speak up or repeat themselves?:No Do you experience ringing or noises in your ears?:No Do you have difficulty understanding soft or whispered voices?:No  Cognitive Testing  Alert? Yes Normal Appearance?Yes  Oriented to person? Yes Place? Yes   Time? Yes  Displays appropriate judgment? Suffers from mental illness with delusions  Can read the correct time from a watch face? yes Are you having problems remembering things?yes  Advanced Directives have been discussed with the patient?Yes, no advanced directives. Will give brochure     List the Names of Other Physician/Practitioners you currently use: (updated)    Indicate any recent Medical Services you may have received from other than [facility] providers in the past year (date may be approximate).     Medicare Attestation  I have personally reviewed:  The patient's medical and social history  Their use of alcohol, tobacco or illicit drugs  Their current medications and supplements  The patient's functional ability including ADLs,fall risks, home safety risks, cognitive, and hearing and visual impairment  Diet and physical activities  Evidence for depression or mood disorders  The patient's weight, height, BMI, and visual acuity have been recorded in the chart. I have made referrals, counseling, and provided education to the patient based on review of the above and I have provided the patient with a written personalized care plan for preventive services.    Physical Exam BP 158/88 mmHg  Pulse 76  Resp 16  Ht 5\' 7"  (1.702 m)  Wt 165 lb (74.844 kg)  BMI 25.84 kg/m2  SpO2 100%    Assessment & Plan:  Medicare annual wellness visit, subsequent Annual exam as documented. Counseling done  re healthy lifestyle involving commitment to 150 minutes exercise per week, heart healthy diet, and attaining healthy weight.The importance of adequate sleep also discussed. Regular seat belt use and  home safety, is also discussed. Changes in health habits are decided on by the patient with goals and time frames  set for achieving them. Immunization and cancer screening needs are specifically addressed at this visit.   Essential hypertension Uncontrolled, non compliant with medication,  reinforced need to take medication as directed. Will also send message to her daughter who cares for her the best she is able F/u in 6 weeks  Psychotic disorder with delusions Ongoing, not a danger to herself or anyone, repeatedly stating that she is the mother of 4 children when she has only one daughter. Had to stop working due to mental health, has inconsistently taken medication, however , fortunately for the past at least 3 years she has been stable off of medication  Decreased vision Decreased vision on screening refer to opthalmology

## 2015-08-22 ENCOUNTER — Encounter: Payer: Self-pay | Admitting: Family Medicine

## 2015-08-22 DIAGNOSIS — H547 Unspecified visual loss: Secondary | ICD-10-CM | POA: Insufficient documentation

## 2015-08-22 DIAGNOSIS — Z0000 Encounter for general adult medical examination without abnormal findings: Secondary | ICD-10-CM | POA: Insufficient documentation

## 2015-08-22 HISTORY — DX: Unspecified visual loss: H54.7

## 2015-08-22 NOTE — Assessment & Plan Note (Signed)

## 2015-08-22 NOTE — Assessment & Plan Note (Signed)
Uncontrolled, non compliant with medication, reinforced need to take medication as directed. Will also send message to her daughter who cares for her the best she is able F/u in 6 weeks

## 2015-08-22 NOTE — Assessment & Plan Note (Signed)
Decreased vision on screening refer to opthalmology

## 2015-08-22 NOTE — Assessment & Plan Note (Signed)
Ongoing, not a danger to herself or anyone, repeatedly stating that she is the mother of 4 children when she has only one daughter. Had to stop working due to mental health, has inconsistently taken medication, however , fortunately for the past at least 3 years she has been stable off of medication

## 2015-09-03 ENCOUNTER — Telehealth: Payer: Self-pay | Admitting: Family Medicine

## 2015-09-03 ENCOUNTER — Other Ambulatory Visit: Payer: Self-pay

## 2015-09-03 MED ORDER — TRIAMTERENE-HCTZ 37.5-25 MG PO TABS: 1.0000 | ORAL_TABLET | Freq: Every day | ORAL | 3 refills | Status: DC

## 2015-09-03 NOTE — Telephone Encounter (Signed)
Spoke with patient and she is aware that her insurance coverage is inactive at this time.  She will work on getting this straightened out.  Mediation sent to [facility].  It is available on the $4 list.

## 2015-09-03 NOTE — Telephone Encounter (Signed)
Pt said her triamterene was not covered by her insurance... Does this happen often and can we switch her to something else?

## 2015-11-20 ENCOUNTER — Ambulatory Visit (INDEPENDENT_AMBULATORY_CARE_PROVIDER_SITE_OTHER): Payer: Medicare Other | Admitting: Family Medicine

## 2015-11-20 ENCOUNTER — Encounter: Payer: Self-pay | Admitting: Family Medicine

## 2015-11-20 VITALS — BP 162/84 | HR 72 | Resp 18 | Ht 67.0 in | Wt 165.0 lb

## 2015-11-20 DIAGNOSIS — F29 Unspecified psychosis not due to a substance or known physiological condition: Secondary | ICD-10-CM

## 2015-11-20 DIAGNOSIS — Z1211 Encounter for screening for malignant neoplasm of colon: Secondary | ICD-10-CM

## 2015-11-20 DIAGNOSIS — Z23 Encounter for immunization: Secondary | ICD-10-CM | POA: Diagnosis not present

## 2015-11-20 DIAGNOSIS — E785 Hyperlipidemia, unspecified: Secondary | ICD-10-CM

## 2015-11-20 DIAGNOSIS — E559 Vitamin D deficiency, unspecified: Secondary | ICD-10-CM

## 2015-11-20 DIAGNOSIS — I10 Essential (primary) hypertension: Secondary | ICD-10-CM

## 2015-11-20 LAB — HEMOCCULT GUIAC POC 1CARD (OFFICE): Fecal Occult Blood, POC: NEGATIVE

## 2015-11-20 NOTE — Patient Instructions (Addendum)
Nurse bP check in 6 weeks  F/u with pelvic and pap in 4 month  NEED to take medicartion for blood pressure every day   Start walking evcery day  Stop salty foods and eat fresh /frozen fruit and vegetable  Rectal exam normal  Flu vaccine today  cBC and chem 7 in in 4 month

## 2015-11-23 DIAGNOSIS — Z23 Encounter for immunization: Secondary | ICD-10-CM

## 2015-11-23 HISTORY — DX: Encounter for immunization: Z23

## 2015-11-23 NOTE — Assessment & Plan Note (Signed)
After obtaining informed consent, the vaccine is  administered by LPN.  

## 2015-11-23 NOTE — Assessment & Plan Note (Signed)
Uncontrolled due to medictionl non compliancere educated re ned to comply , daughter present DASH diet and commitment to daily physical activity for a minimum of 30 minutes discussed and encouraged, as a part of hypertension management. The importance of attaining a healthy weight is also discussed.  BP/Weight 11/20/2015 08/19/2015 05/09/2014 12/20/2013 10/06/2013 09/12/2013 [id number]  Systolic BP 0000000 0000000 123456 [id number]  Diastolic BP 84 88 74 62 77 76 80  Wt. (Lbs) 165 165 152 144.04 139 139.08 138  BMI 25.84 25.84 23.8 22.55 21.77 21.78 21.61

## 2015-11-23 NOTE — Assessment & Plan Note (Signed)
No recent de compensation, daughter reports some improvement in her overall level of function, maintained on no medication, non compliance is the problem No danger to herself or to anyone  currently

## 2015-11-23 NOTE — Assessment & Plan Note (Signed)
Hyperlipidemia:Low fat diet discussed and encouraged.   Lipid Panel  Lab Results  Component Value Date   CHOL 201 (H) 06/21/2015   HDL 78 06/21/2015   LDLCALC 114 06/21/2015   TRIG 44 06/21/2015   CHOLHDL 2.6 06/21/2015  Updated lab needed at/ before next visit.

## 2015-11-23 NOTE — Progress Notes (Signed)
   Gina Black     MRN: [id number]      DOB: 01-08-1956   HPI Gina Black is here for follow up and re-evaluation of chronic medical conditions, medication management and review of any available recent lab and radiology data.  Preventive health is updated, specifically  Cancer screening and Immunization.   Questions or concerns regarding consultations or procedures which the PT has had in the interim are  addressed. The PT denies any adverse reactions to current medications since the last visit.  There are no new concerns.  There are no specific complaints   ROS Denies recent fever or chills. Denies sinus pressure, nasal congestion, ear pain or sore throat. Denies chest congestion, productive cough or wheezing. Denies chest pains, palpitations and leg swelling Denies abdominal pain, nausea, vomiting,diarrhea or constipation.   Denies dysuria, frequency, hesitancy or incontinence. Denies joint pain, swelling and limitation in mobility. Denies headaches, seizures, numbness, or tingling. Denies depression, anxiety or insomnia. Denies skin break down or rash.   PE  BP (!) 162/84   Pulse 72   Resp 18   Ht 5\' 7"  (1.702 m)   Wt 165 lb (74.8 kg)   SpO2 98%   BMI 25.84 kg/m   Patient alert and oriented and in no cardiopulmonary distress.  HEENT: No facial asymmetry, EOMI,   oropharynx pink and moist.  Neck supple no JVD, no mass.  Chest: Clear to auscultation bilaterally.  CVS: S1, S2 no murmurs, no S3.Regular rate.  ABD: Soft non tender. No organomegaly or mass, normal BS Rectal; no mass, heme negative stool  Ext: No edema  MS: Adequate ROM spine, shoulders, hips and knees.  Skin: Intact, no ulcerations or rash noted.  Psych: Good eye contact, blunted  affect. Memory impaired not anxious or depressed appearing.Poor judgement due to mental illness  CNS: CN 2-12 intact, power,  normal throughout.no focal deficits noted.   Assessment & Plan  Essential  hypertension Uncontrolled due to medictionl non compliancere educated re ned to comply , daughter present DASH diet and commitment to daily physical activity for a minimum of 30 minutes discussed and encouraged, as a part of hypertension management. The importance of attaining a healthy weight is also discussed.  BP/Weight 11/20/2015 08/19/2015 05/09/2014 12/20/2013 10/06/2013 09/12/2013 [id number]  Systolic BP 0000000 0000000 123456 [id number]  Diastolic BP 84 88 74 62 77 76 80  Wt. (Lbs) 165 165 152 144.04 139 139.08 138  BMI 25.84 25.84 23.8 22.55 21.77 21.78 21.61       Psychotic disorder with delusions No recent de compensation, daughter reports some improvement in her overall level of function, maintained on no medication, non compliance is the problem No danger to herself or to anyone  currently  Vitamin D deficiency Updated lab needed at/ before next visit.   Dyslipidemia Hyperlipidemia:Low fat diet discussed and encouraged.   Lipid Panel  Lab Results  Component Value Date   CHOL 201 (H) 06/21/2015   HDL 78 06/21/2015   LDLCALC 114 06/21/2015   TRIG 44 06/21/2015   CHOLHDL 2.6 06/21/2015  Updated lab needed at/ before next visit.      Need for prophylactic vaccination and inoculation against influenza After obtaining informed consent, the vaccine is  administered by LPN.

## 2015-11-23 NOTE — Assessment & Plan Note (Signed)
Updated lab needed at/ before next visit.   

## 2015-12-18 ENCOUNTER — Other Ambulatory Visit: Payer: Self-pay | Admitting: Family Medicine

## 2015-12-18 DIAGNOSIS — Z1231 Encounter for screening mammogram for malignant neoplasm of breast: Secondary | ICD-10-CM

## 2015-12-23 ENCOUNTER — Ambulatory Visit: Payer: Medicare Other | Admitting: Family Medicine

## 2015-12-29 ENCOUNTER — Other Ambulatory Visit: Payer: Self-pay | Admitting: Family Medicine

## 2016-01-05 ENCOUNTER — Ambulatory Visit (HOSPITAL_COMMUNITY)
Admission: RE | Admit: 2016-01-05 | Discharge: 2016-01-05 | Disposition: A | Discharge status: Home / Self Care | Payer: Medicare Other | Source: Ambulatory Visit | Attending: Family Medicine | Admitting: Family Medicine

## 2016-01-05 DIAGNOSIS — Z1231 Encounter for screening mammogram for malignant neoplasm of breast: Secondary | ICD-10-CM | POA: Diagnosis not present

## 2016-02-04 IMAGING — MG MM DIGITAL SCREENING
5 series · 5 of 5 positions shown · non-contrast
Comparison: Previous exam(s).

CLINICAL DATA: Screening.

EXAM:
DIGITAL SCREENING BILATERAL MAMMOGRAM WITH CAD

[L CC]
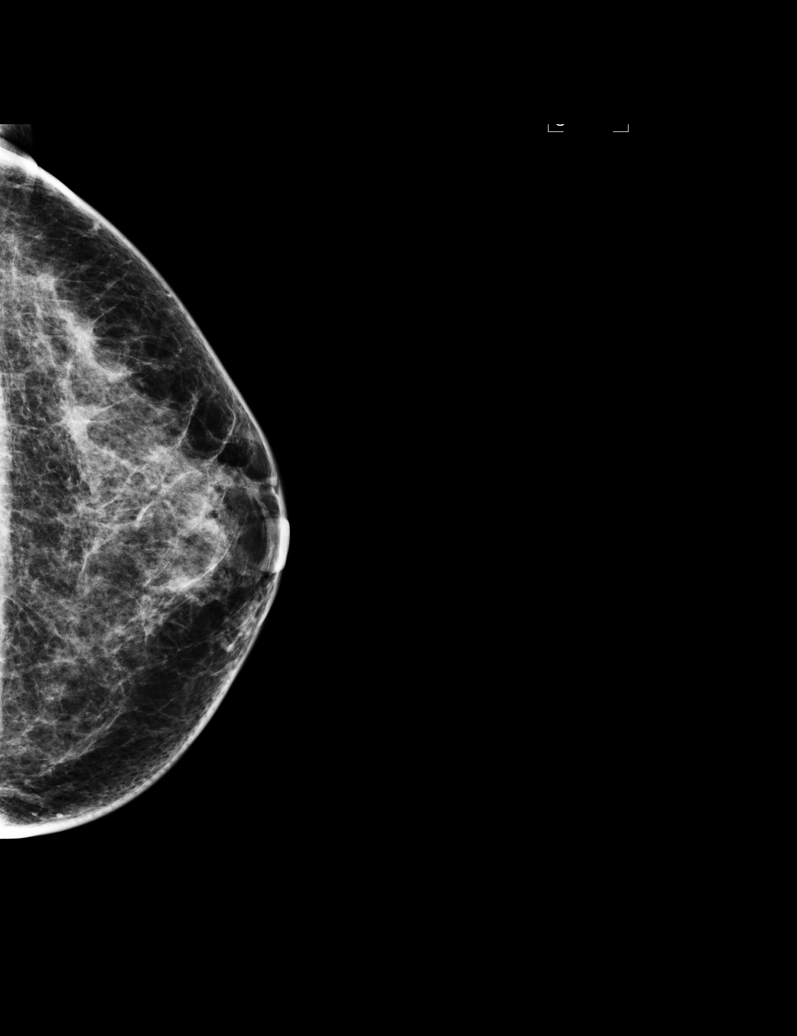

[L MLO]
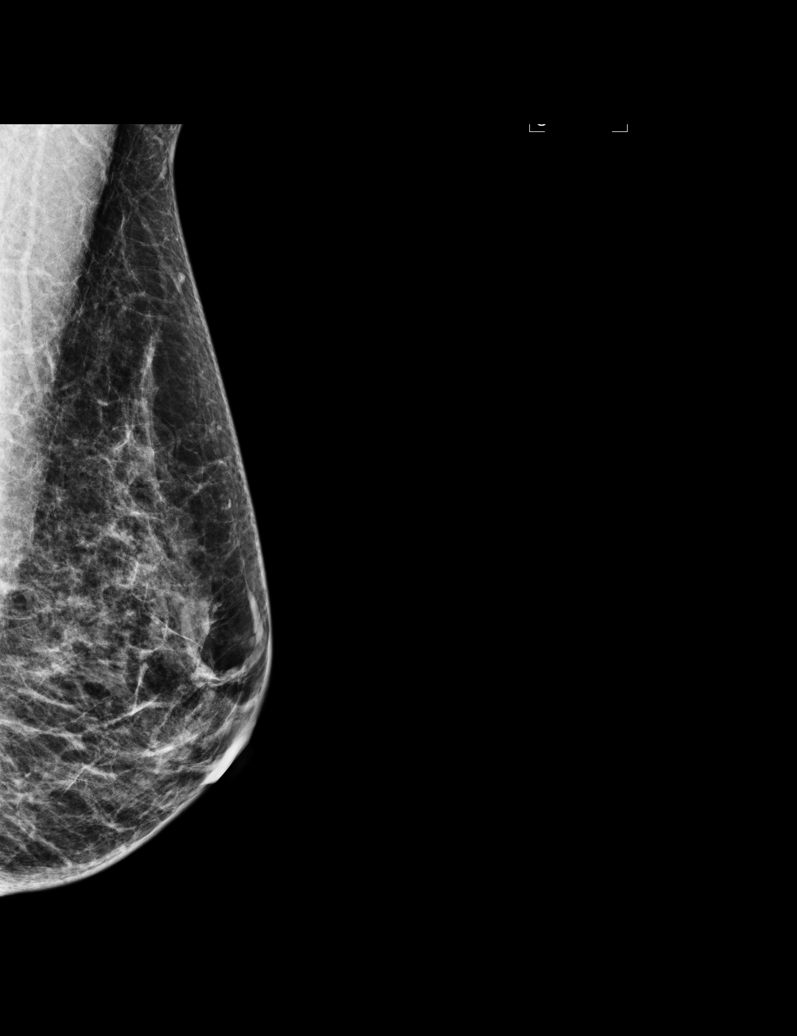

[R CC]
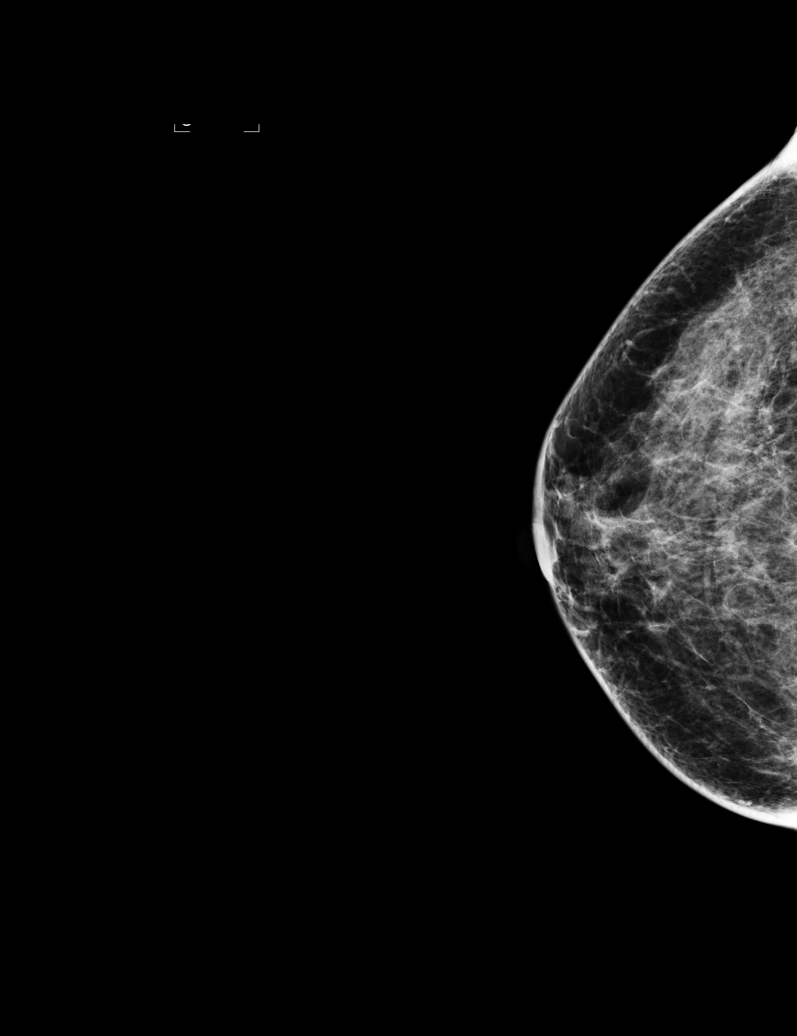

[R MLO (1 of 2)]
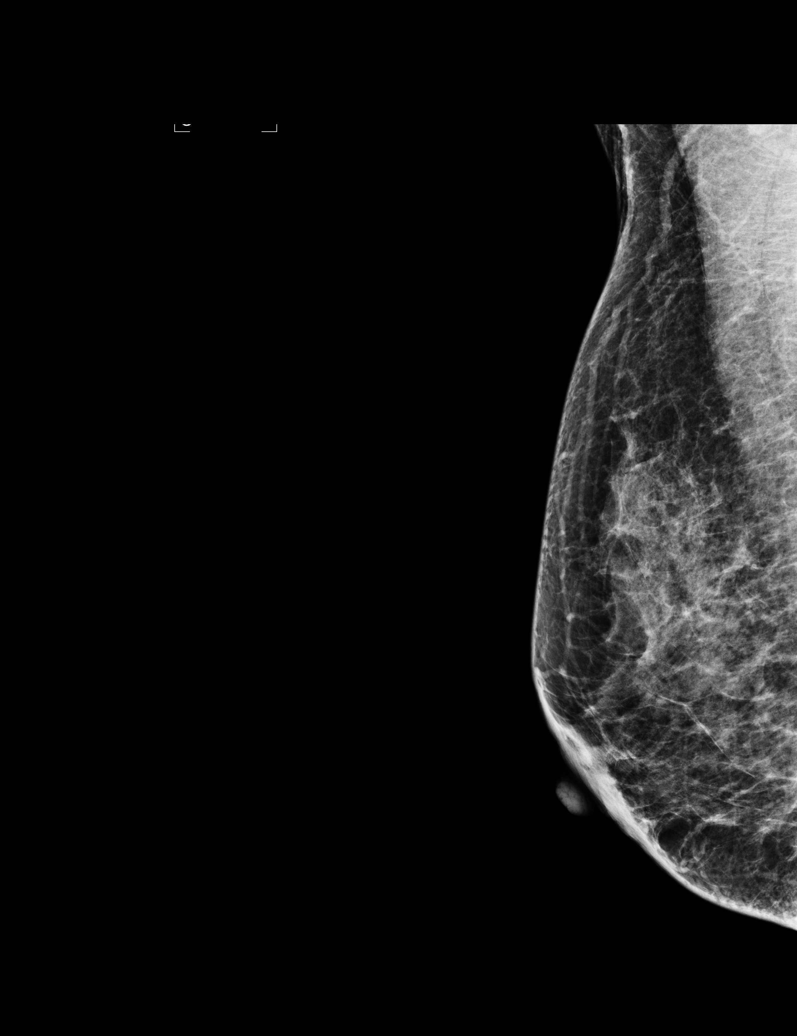

[R MLO (2 of 2)]
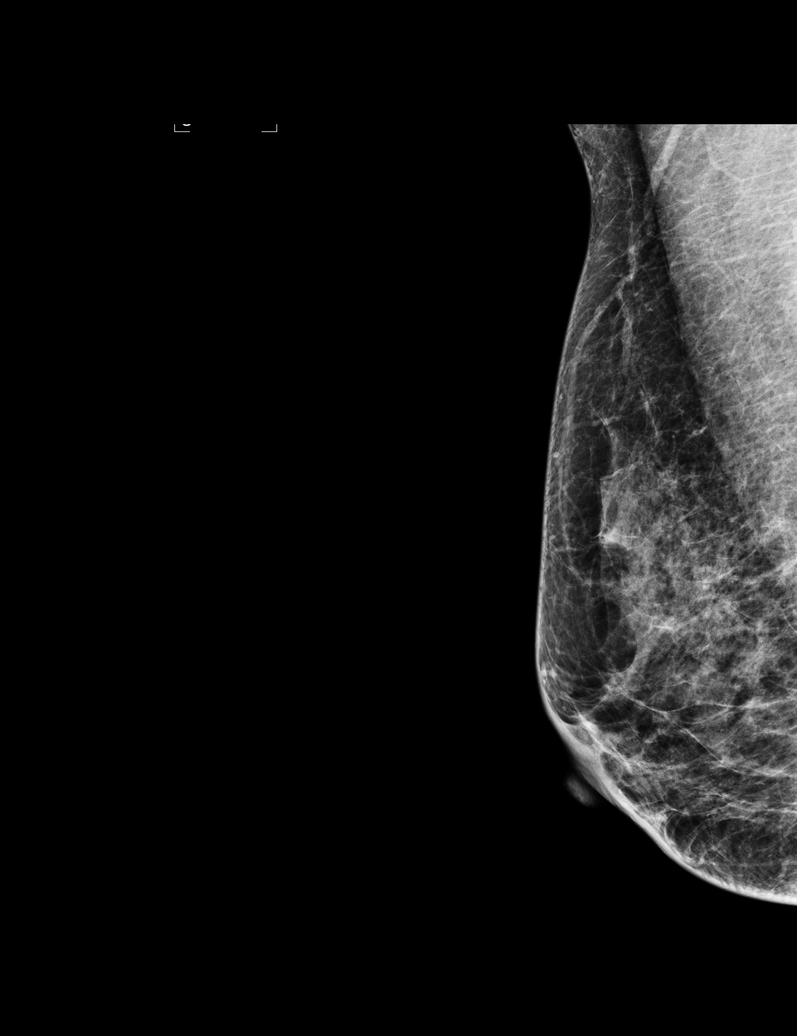

[5 of 5 positions shown; findings below may reference images not displayed]

ACR Breast Density Category c: The breast tissue is heterogeneously
dense, which may obscure small masses.
FINDINGS: There are no findings suspicious for malignancy. Images were
processed with CAD.
IMPRESSION: No mammographic evidence of malignancy. A result letter of this
screening mammogram will be mailed directly to the patient.

RECOMMENDATION:
Screening mammogram in one year. (Code:[id number])

BI-RADS CATEGORY  1: Negative.

## 2016-03-22 ENCOUNTER — Encounter: Payer: Medicare Other | Admitting: Family Medicine

## 2016-06-11 ENCOUNTER — Telehealth: Payer: Self-pay | Admitting: Family Medicine

## 2016-06-11 NOTE — Telephone Encounter (Signed)
Patient's daughter Mikle Bosworth called to RS appt for Monday 06/14/16 @9 .  Patient was to have a physical.  The daughter states the caretaker will not be able to bring her and daughter's is looking for help.  The appt is now scheduled in August.  Daughter would like to make sure that this does not affect her mother being able to get her  Prescriptions.  If so, she will try to get her in sooner, but for now her schedule would allow August and being able to accompany her mom to the appt.

## 2016-06-14 ENCOUNTER — Encounter: Payer: Medicare Other | Admitting: Family Medicine

## 2016-06-14 NOTE — Telephone Encounter (Signed)
Noted , pls make sure that she gets her medications filled till her appt date

## 2016-06-14 NOTE — Telephone Encounter (Signed)
fyi

## 2016-06-15 ENCOUNTER — Other Ambulatory Visit: Payer: Self-pay

## 2016-06-15 MED ORDER — TRIAMTERENE-HCTZ 37.5-25 MG PO TABS: 1.0000 | ORAL_TABLET | Freq: Every day | ORAL | 5 refills | Status: DC

## 2016-06-15 MED ORDER — POTASSIUM CHLORIDE CRYS ER 10 MEQ PO TBCR: 10.0000 meq | EXTENDED_RELEASE_TABLET | Freq: Two times a day (BID) | ORAL | 5 refills | Status: DC

## 2016-06-15 NOTE — Telephone Encounter (Signed)
meds refilled x 5

## 2016-06-21 ENCOUNTER — Other Ambulatory Visit: Payer: Self-pay | Admitting: Family Medicine

## 2016-06-25 ENCOUNTER — Other Ambulatory Visit: Payer: Self-pay | Admitting: Family Medicine

## 2016-07-02 ENCOUNTER — Other Ambulatory Visit: Payer: Self-pay | Admitting: Family Medicine

## 2016-07-15 ENCOUNTER — Other Ambulatory Visit: Payer: Self-pay | Admitting: Family Medicine

## 2016-07-21 ENCOUNTER — Other Ambulatory Visit: Payer: Self-pay | Admitting: Family Medicine

## 2016-07-26 ENCOUNTER — Other Ambulatory Visit: Payer: Self-pay

## 2016-07-26 MED ORDER — TRIAMTERENE-HCTZ 37.5-25 MG PO TABS: 1.0000 | ORAL_TABLET | Freq: Every day | ORAL | 3 refills | Status: DC

## 2016-09-17 ENCOUNTER — Encounter: Payer: Self-pay | Admitting: Family Medicine

## 2016-09-20 ENCOUNTER — Other Ambulatory Visit: Payer: Self-pay | Admitting: Family Medicine

## 2016-09-20 DIAGNOSIS — F22 Delusional disorders: Secondary | ICD-10-CM

## 2016-09-23 ENCOUNTER — Encounter: Payer: Self-pay | Admitting: Family Medicine

## 2016-09-23 ENCOUNTER — Ambulatory Visit (INDEPENDENT_AMBULATORY_CARE_PROVIDER_SITE_OTHER): Payer: Medicare Other | Admitting: Family Medicine

## 2016-09-23 VITALS — BP 140/80 | HR 70 | Resp 15 | Ht 67.0 in | Wt 170.0 lb

## 2016-09-23 DIAGNOSIS — Z124 Encounter for screening for malignant neoplasm of cervix: Secondary | ICD-10-CM | POA: Diagnosis not present

## 2016-09-23 DIAGNOSIS — F29 Unspecified psychosis not due to a substance or known physiological condition: Secondary | ICD-10-CM | POA: Diagnosis not present

## 2016-09-23 DIAGNOSIS — Z1231 Encounter for screening mammogram for malignant neoplasm of breast: Secondary | ICD-10-CM

## 2016-09-23 DIAGNOSIS — L84 Corns and callosities: Secondary | ICD-10-CM | POA: Diagnosis not present

## 2016-09-23 DIAGNOSIS — Z1211 Encounter for screening for malignant neoplasm of colon: Secondary | ICD-10-CM | POA: Diagnosis not present

## 2016-09-23 DIAGNOSIS — B351 Tinea unguium: Secondary | ICD-10-CM | POA: Diagnosis not present

## 2016-09-23 DIAGNOSIS — E559 Vitamin D deficiency, unspecified: Secondary | ICD-10-CM | POA: Diagnosis not present

## 2016-09-23 DIAGNOSIS — I10 Essential (primary) hypertension: Secondary | ICD-10-CM

## 2016-09-23 DIAGNOSIS — E785 Hyperlipidemia, unspecified: Secondary | ICD-10-CM

## 2016-09-23 DIAGNOSIS — Z01419 Encounter for gynecological examination (general) (routine) without abnormal findings: Secondary | ICD-10-CM

## 2016-09-23 NOTE — Patient Instructions (Addendum)
Wellness exam with nurse in October and flu vaccine at visit  MD follow up in 6 months  You have already been referred to psychiatry  Mammogram due nov 28 or after call [phone] to schedule  Referral already made for mental health  Fasting CBC, lipid, chem 7 , TSH and vit D this week please  Pap and pelvic exam today with rectal exam  You are referred to Dr Berline Lopes for foot care  Thank you  for choosing [facility]. We consider it a privelige to serve you.  Delivering excellent health care in a caring and  compassionate way is our goal.  Partnering with you,  so that together we can achieve this goal is our strategy.

## 2016-09-24 ENCOUNTER — Other Ambulatory Visit (HOSPITAL_COMMUNITY)
Admission: RE | Admit: 2016-09-24 | Discharge: 2016-09-24 | Disposition: A | Discharge status: Home / Self Care | Payer: Medicare Other | Source: Ambulatory Visit | Attending: Family Medicine | Admitting: Family Medicine

## 2016-09-24 DIAGNOSIS — Z124 Encounter for screening for malignant neoplasm of cervix: Secondary | ICD-10-CM | POA: Insufficient documentation

## 2016-09-24 DIAGNOSIS — Z1211 Encounter for screening for malignant neoplasm of colon: Secondary | ICD-10-CM | POA: Insufficient documentation

## 2016-09-24 LAB — POC HEMOCCULT BLD/STL (OFFICE/1-CARD/DIAGNOSTIC): Fecal Occult Blood, POC: NEGATIVE

## 2016-09-25 ENCOUNTER — Encounter: Payer: Self-pay | Admitting: Family Medicine

## 2016-09-25 DIAGNOSIS — Z124 Encounter for screening for malignant neoplasm of cervix: Secondary | ICD-10-CM | POA: Insufficient documentation

## 2016-09-25 DIAGNOSIS — L84 Corns and callosities: Secondary | ICD-10-CM | POA: Insufficient documentation

## 2016-09-25 DIAGNOSIS — D126 Benign neoplasm of colon, unspecified: Secondary | ICD-10-CM

## 2016-09-25 HISTORY — DX: Benign neoplasm of colon, unspecified: D12.6

## 2016-09-25 HISTORY — DX: Corns and callosities: L84

## 2016-09-25 LAB — CBC
HCT: 39.3 % (ref 35.0–45.0)
HEMOGLOBIN: 12.8 g/dL (ref 11.7–15.5)
MCH: 27 pg (ref 27.0–33.0)
MCHC: 32.6 g/dL (ref 32.0–36.0)
MCV: 82.9 fL (ref 80.0–100.0)
MPV: 9.5 fL (ref 7.5–12.5)
Platelets: 217 10*3/uL (ref 140–400)
RBC: 4.74 MIL/uL (ref 3.80–5.10)
RDW: 13.6 % (ref 11.0–15.0)
WBC: 4.9 10*3/uL (ref 3.8–10.8)

## 2016-09-25 LAB — COMPLETE METABOLIC PANEL WITH GFR
ALBUMIN: 4 g/dL (ref 3.6–5.1)
ALK PHOS: 49 U/L (ref 33–130)
ALT: 15 U/L (ref 6–29)
AST: 20 U/L (ref 10–35)
BILIRUBIN TOTAL: 0.9 mg/dL (ref 0.2–1.2)
BUN: 15 mg/dL (ref 7–25)
CALCIUM: 9.2 mg/dL (ref 8.6–10.4)
CO2: 26 mmol/L (ref 20–32)
Chloride: 105 mmol/L (ref 98–110)
Creat: 0.91 mg/dL (ref 0.50–0.99)
GFR, EST AFRICAN AMERICAN: 78 mL/min (ref >=60)
GFR, Est Non African American: 68 mL/min (ref >=60)
Glucose, Bld: 89 mg/dL (ref 65–99)
Potassium: 3.7 mmol/L (ref 3.5–5.3)
Sodium: 140 mmol/L (ref 135–146)
TOTAL PROTEIN: 7.2 g/dL (ref 6.1–8.1)

## 2016-09-25 LAB — LIPID PANEL
Cholesterol: 193 mg/dL (ref <200)
HDL: 70 mg/dL (ref >50)
LDL Cholesterol: 114 mg/dL — ABNORMAL HIGH (ref <100)
Total CHOL/HDL Ratio: 2.8 Ratio (ref <5.0)
Triglycerides: 43 mg/dL (ref <150)
VLDL: 9 mg/dL (ref <30)

## 2016-09-25 NOTE — Assessment & Plan Note (Signed)
recal exam no palpable mass and heme negative stool

## 2016-09-25 NOTE — Assessment & Plan Note (Signed)
Referred to psychiatry prior to visit for management due to decompensation

## 2016-09-25 NOTE — Progress Notes (Signed)
Gina Black     MRN: [id number]      DOB: March 13, 1955   HPI Gina Black is here for follow up and re-evaluation of chronic medical conditions, medication management and review of any available recent lab and radiology data.  Preventive health is updated, specifically  Cancer screening and Immunization.  Pap smear is needed The PT denies any adverse reactions to current medications since the last visit.  Daughter has already sent a message ahead of the visit documenting increased psychotic behavior and the need for mental health care, referral has been made, she has required psychiatric care in the past , her insight into her illness and acceptance of this is nil, medication adherence without psychiatric involvement will be unsuccessful Daughter also c/o callus on soles of feet and thickened long toenails , requests Gina Black for podiatrist care  ROS See HPI Patient herself voices no concerns , however her mental health is such that she is incapable of providing a reliable history. Her daughter , who is responsible for her and who accompanies her , provides the history Concerns are as above Denies recent fever or chills. Denies sinus pressure, nasal congestion, ear pain or sore throat. Denies chest congestion, productive cough or wheezing. Denies chest pains, palpitations and leg swelling Denies abdominal pain, nausea, vomiting,diarrhea or constipation.   Denies dysuria, frequency, hesitancy or incontinence. Denies joint pain, swelling and limitation in mobility. Denies headaches, seizures, numbness, or tingling.  PE  BP 140/80   Pulse 70   Resp 15   Ht 5\' 7"  (1.702 m)   Wt 170 lb (77.1 kg)   SpO2 99%   BMI 26.63 kg/m   Patient alert and in no cardiopulmonary distress.Flat affect  HEENT: No facial asymmetry, EOMI,   oropharynx pink and moist.  Neck supple no JVD, no mass.  Chest: Clear to auscultation bilaterally. Breast: No mass, no nipple discharge, no assymetry, no  axillary or supraclavicular nodes CVS: S1, S2 no murmurs, no S3.Regular rate.  ABD: Soft non tender. No organomegaly or mass Rectal : no mass, heme negative stool GU: External genitalia: normal female distribution of hair, no ulcers, scant physiologic discharge. Uterus atrophic, no adnexal masses, no cervical motion or adnexal tenderness  Ext: No edema  MS: Adequate ROM spine, shoulders, hips and knees.  Skin: Intact,calluses and corns on soles of feet Thickened dystrophic toenail which are long.  Psych: Good eye contact, normal affect. Memory intact not anxious or depressed appearing.  CNS: CN 2-12 intact, power,  normal throughout.no focal deficits noted.   Assessment & Plan Screening for malignant neoplasm of cervix Pap sent  Screening for malignant neoplasm of colon recal exam no palpable mass and heme negative stool  Essential hypertension Not at goal, non compliant , reports taking hals tablet not the whole as prescribed , advised of need to take as prescribed DASH diet and commitment to daily physical activity for a minimum of 30 minutes discussed and encouraged, as a part of hypertension management. The importance of attaining a healthy weight is also discussed.  BP/Weight 09/23/2016 11/20/2015 08/19/2015 05/09/2014 12/20/2013 9/83/3825 0/06/3974  Systolic BP 734 193 790 240 973 532 992  Diastolic BP 80 84 88 74 62 77 76  Wt. (Lbs) 170 165 165 152 144.04 139 139.08  BMI 26.63 25.84 25.84 23.8 22.55 21.77 21.78       Plantar callus Bilateral plantar calluses and also thickened fungal toenails, refer to podiatry for footcare  Psychotic disorder with delusions Referred to  psychiatry prior to visit for management due to decompensation

## 2016-09-25 NOTE — Assessment & Plan Note (Signed)
Not at goal, non compliant , reports taking hals tablet not the whole as prescribed , advised of need to take as prescribed DASH diet and commitment to daily physical activity for a minimum of 30 minutes discussed and encouraged, as a part of hypertension management. The importance of attaining a healthy weight is also discussed.  BP/Weight 09/23/2016 11/20/2015 08/19/2015 05/09/2014 12/20/2013 0/37/5436 0/07/7701  Systolic BP 403 524 818 590 931 121 624  Diastolic BP 80 84 88 74 62 77 76  Wt. (Lbs) 170 165 165 152 144.04 139 139.08  BMI 26.63 25.84 25.84 23.8 22.55 21.77 21.78

## 2016-09-25 NOTE — Assessment & Plan Note (Signed)
Pap sent ?

## 2016-09-25 NOTE — Assessment & Plan Note (Signed)
Bilateral plantar calluses and also thickened fungal toenails, refer to podiatry for footcare

## 2016-09-27 ENCOUNTER — Encounter: Payer: Self-pay | Admitting: Family Medicine

## 2016-09-27 ENCOUNTER — Telehealth: Payer: Self-pay | Admitting: Family Medicine

## 2016-09-27 LAB — VITAMIN D 25 HYDROXY (VIT D DEFICIENCY, FRACTURES): VIT D 25 HYDROXY: 30 ng/mL (ref 30–100)

## 2016-09-27 LAB — TSH: TSH: 1.53 m[IU]/L

## 2016-09-27 NOTE — Telephone Encounter (Signed)
Patient's daughter would like to know if referral was sent to the Psychiatrist for patient.  Please call her if you need to as this was communicated thru [place]   cb  [phone]

## 2016-09-27 NOTE — Telephone Encounter (Signed)
I don't see a referral entered.

## 2016-09-27 NOTE — Telephone Encounter (Signed)
Entered since 09/20/2016

## 2016-09-28 ENCOUNTER — Other Ambulatory Visit: Payer: Self-pay

## 2016-09-28 ENCOUNTER — Telehealth (HOSPITAL_COMMUNITY): Payer: Self-pay | Admitting: *Deleted

## 2016-09-28 ENCOUNTER — Encounter: Payer: Self-pay | Admitting: Family Medicine

## 2016-09-28 LAB — CYTOLOGY - PAP
DIAGNOSIS: NEGATIVE
HPV: NOT DETECTED

## 2016-09-28 NOTE — Telephone Encounter (Signed)
Daughter calling about psych referral status. Please advise

## 2016-09-28 NOTE — Telephone Encounter (Signed)
There is no psych referral showing on my end. Please enter the referral and I can send it. Last psych referral seen 2015.

## 2016-09-28 NOTE — Telephone Encounter (Signed)
Update sent to [facility]

## 2016-09-28 NOTE — Telephone Encounter (Signed)
phone call to cell phone, some picked up phone and hung up.   called back to home number and left voice message regarding an appointment.

## 2016-10-01 ENCOUNTER — Telehealth: Payer: Self-pay | Admitting: *Deleted

## 2016-10-01 NOTE — Telephone Encounter (Signed)
Patient called requesting the medication for the bacteria for her toe nails. Please advise

## 2016-10-01 NOTE — Telephone Encounter (Signed)
She is going to have that treated by th Podiatrist she is referred to, this was discussed at the visit

## 2016-10-04 ENCOUNTER — Telehealth (HOSPITAL_COMMUNITY): Payer: Self-pay | Admitting: *Deleted

## 2016-10-04 NOTE — Telephone Encounter (Signed)
Called patient regarding message below. No answer, left generic message for patient to return call.   

## 2016-10-04 NOTE — Telephone Encounter (Signed)
phone call from someone named (sounded like Berneta Sages, could not understand what she was saying), asked for name to confirm whom I was speaking with.   She yelled and said she was Seana's caregiver and she was calling for her.   She did not confirm her name, and hung up.

## 2016-10-05 NOTE — Telephone Encounter (Signed)
Called patient regarding message below. No answer, unable to leave message.  

## 2016-10-06 ENCOUNTER — Other Ambulatory Visit: Payer: Self-pay | Admitting: Family Medicine

## 2016-10-06 ENCOUNTER — Telehealth: Payer: Self-pay | Admitting: Family Medicine

## 2016-10-06 MED ORDER — QUETIAPINE FUMARATE 25 MG PO TABS: 25.0000 mg | ORAL_TABLET | Freq: Every day | ORAL | 4 refills | Status: DC

## 2016-10-06 NOTE — Telephone Encounter (Signed)
Patient calling to requesting something for toenail fungus that she has had for over a month.  If you are able to prescribe something, please call in @ [facility] and have them deliver.

## 2016-10-06 NOTE — Telephone Encounter (Signed)
Recent liver function normal.

## 2016-10-07 ENCOUNTER — Other Ambulatory Visit: Payer: Self-pay | Admitting: Family Medicine

## 2016-10-07 ENCOUNTER — Encounter: Payer: Self-pay | Admitting: Family Medicine

## 2016-10-07 MED ORDER — TERBINAFINE HCL 250 MG PO TABS: 250.0000 mg | ORAL_TABLET | Freq: Every day | ORAL | 2 refills | Status: DC

## 2016-10-07 NOTE — Telephone Encounter (Signed)
Terbinafine tablet is prescribed for 3 months

## 2016-10-13 ENCOUNTER — Telehealth (HOSPITAL_COMMUNITY): Payer: Self-pay

## 2016-10-25 ENCOUNTER — Encounter: Payer: Self-pay | Admitting: Family Medicine

## 2016-10-25 DIAGNOSIS — M79675 Pain in left toe(s): Secondary | ICD-10-CM | POA: Diagnosis not present

## 2016-10-25 DIAGNOSIS — B351 Tinea unguium: Secondary | ICD-10-CM | POA: Diagnosis not present

## 2016-10-25 DIAGNOSIS — M79674 Pain in right toe(s): Secondary | ICD-10-CM | POA: Diagnosis not present

## 2016-10-25 DIAGNOSIS — I739 Peripheral vascular disease, unspecified: Secondary | ICD-10-CM | POA: Diagnosis not present

## 2016-10-26 DIAGNOSIS — B351 Tinea unguium: Secondary | ICD-10-CM | POA: Diagnosis not present

## 2016-10-26 DIAGNOSIS — I10 Essential (primary) hypertension: Secondary | ICD-10-CM | POA: Diagnosis not present

## 2016-11-24 ENCOUNTER — Ambulatory Visit: Payer: Medicare Other

## 2016-12-13 ENCOUNTER — Ambulatory Visit: Payer: Medicare Other

## 2016-12-20 ENCOUNTER — Ambulatory Visit (INDEPENDENT_AMBULATORY_CARE_PROVIDER_SITE_OTHER): Payer: Medicare Other

## 2016-12-20 VITALS — BP 146/86 | HR 75 | Temp 98.1°F | Resp 16 | Ht 67.0 in | Wt 179.0 lb

## 2016-12-20 DIAGNOSIS — Z0000 Encounter for general adult medical examination without abnormal findings: Secondary | ICD-10-CM | POA: Diagnosis not present

## 2016-12-20 DIAGNOSIS — Z23 Encounter for immunization: Secondary | ICD-10-CM | POA: Diagnosis not present

## 2016-12-20 NOTE — Patient Instructions (Signed)
Gina [name] , Thank you for taking time to come for your Medicare Wellness Visit. I appreciate your ongoing commitment to your health goals. Please review the following plan we discussed and let me know if I can assist you in the future.   Screening recommendations/referrals: Colonoscopy: 2019 Mammogram: 2019 Bone Density: Discuss with PCP Recommended yearly ophthalmology/optometry visit for glaucoma screening and checkup Recommended yearly dental visit for hygiene and checkup  Vaccinations: Influenza vaccine: Today Pneumococcal vaccine: Discuss at age 3 Tdap vaccine: 2024 Shingles vaccine: Discuss with PCP    Advanced directives: Discussed in visit today, please complete and return  Conditions/risks identified: Fall risk  Next appointment: 04/04/17 with Dr. Moshe Cipro  Preventive Care 40-64 Years, Female Preventive care refers to lifestyle choices and visits with your health care provider that can promote health and wellness. What does preventive care include?  A yearly physical exam. This is also called an annual well check.  Dental exams once or twice a year.  Routine eye exams. Ask your health care provider how often you should have your eyes checked.  Personal lifestyle choices, including:  Daily care of your teeth and gums.  Regular physical activity.  Eating a healthy diet.  Avoiding tobacco and drug use.  Limiting alcohol use.  Practicing safe sex.  Taking low-dose aspirin daily starting at age 42.  Taking vitamin and mineral supplements as recommended by your health care provider. What happens during an annual well check? The services and screenings done by your health care provider during your annual well check will depend on your age, overall health, lifestyle risk factors, and family history of disease. Counseling  Your health care provider may ask you questions about your:  Alcohol use.  Tobacco use.  Drug use.  Emotional well-being.  [name]  and relationship well-being.  Sexual activity.  Eating habits.  Work and work Statistician.  Method of birth control.  Menstrual cycle.  Pregnancy history. Screening  You may have the following tests or measurements:  Height, weight, and BMI.  Blood pressure.  Lipid and cholesterol levels. These may be checked every 5 years, or more frequently if you are over 80 years old.  Skin check.  Lung cancer screening. You may have this screening every year starting at age 65 if you have a 30-pack-year history of smoking and currently smoke or have quit within the past 15 years.  Fecal occult blood test (FOBT) of the stool. You may have this test every year starting at age 11.  Flexible sigmoidoscopy or colonoscopy. You may have a sigmoidoscopy every 5 years or a colonoscopy every 10 years starting at age 15.  Hepatitis C blood test.  Hepatitis B blood test.  Sexually transmitted disease (STD) testing.  Diabetes screening. This is done by checking your blood sugar (glucose) after you have not eaten for a while (fasting). You may have this done every 1-3 years.  Mammogram. This may be done every 1-2 years. Talk to your health care provider about when you should start having regular mammograms. This may depend on whether you have a family history of breast cancer.  BRCA-related cancer screening. This may be done if you have a family history of breast, ovarian, tubal, or peritoneal cancers.  Pelvic exam and Pap test. This may be done every 3 years starting at age 1. Starting at age 40, this may be done every 5 years if you have a Pap test in combination with an HPV test.  Bone density scan. This is  done to screen for osteoporosis. You may have this scan if you are at high risk for osteoporosis. Discuss your test results, treatment options, and if necessary, the need for more tests with your health care provider. Vaccines  Your health care provider may recommend certain vaccines,  such as:  Influenza vaccine. This is recommended every year.  Tetanus, diphtheria, and acellular pertussis (Tdap, Td) vaccine. You may need a Td booster every 10 years.  Zoster vaccine. You may need this after age 7.  Pneumococcal 13-valent conjugate (PCV13) vaccine. You may need this if you have certain conditions and were not previously vaccinated.  Pneumococcal polysaccharide (PPSV23) vaccine. You may need one or two doses if you smoke cigarettes or if you have certain conditions. Talk to your health care provider about which screenings and vaccines you need and how often you need them. This information is not intended to replace advice given to you by your health care provider. Make sure you discuss any questions you have with your health care provider. Document Released: 02/21/2015 Document Revised: 10/15/2015 Document Reviewed: 11/26/2014 Elsevier Interactive Patient Education  2017 Gina [name] Falls can cause injuries. They can happen to people of all ages. There are many things you can do to make your [name] safe and to help prevent falls. What can I do on the outside of my [name]?  Regularly fix the edges of walkways and driveways and fix any cracks.  Remove anything that might make you trip as you walk through a door, such as a raised step or threshold.  Trim any bushes or trees on the path to your [name].  Use bright outdoor lighting.  Clear any walking paths of anything that might make someone trip, such as rocks or tools.  Regularly check to see if handrails are loose or broken. Make sure that both sides of any steps have handrails.  Any raised decks and porches should have guardrails on the edges.  Have any leaves, snow, or ice cleared regularly.  Use sand or salt on walking paths during winter.  Clean up any spills in your garage right away. This includes oil or grease spills. What can I do in the bathroom?  Use night  lights.  Install grab bars by the toilet and in the tub and shower. Do not use towel bars as grab bars.  Use non-skid mats or decals in the tub or shower.  If you need to sit down in the shower, use a plastic, non-slip stool.  Keep the floor dry. Clean up any water that spills on the floor as soon as it happens.  Remove soap buildup in the tub or shower regularly.  Attach bath mats securely with double-sided non-slip rug tape.  Do not have throw rugs and other things on the floor that can make you trip. What can I do in the bedroom?  Use night lights.  Make sure that you have a light by your bed that is easy to reach.  Do not use any sheets or blankets that are too big for your bed. They should not hang down onto the floor.  Have a firm chair that has side arms. You can use this for support while you get dressed.  Do not have throw rugs and other things on the floor that can make you trip. What can I do in the kitchen?  Clean up any spills right away.  Avoid walking on wet floors.  Keep items  that you use a lot in easy-to-reach places.  If you need to reach something above you, use a strong step stool that has a grab bar.  Keep electrical cords out of the way.  Do not use floor polish or wax that makes floors slippery. If you must use wax, use non-skid floor wax.  Do not have throw rugs and other things on the floor that can make you trip. What can I do with my stairs?  Do not leave any items on the stairs.  Make sure that there are handrails on both sides of the stairs and use them. Fix handrails that are broken or loose. Make sure that handrails are as long as the stairways.  Check any carpeting to make sure that it is firmly attached to the stairs. Fix any carpet that is loose or worn.  Avoid having throw rugs at the top or bottom of the stairs. If you do have throw rugs, attach them to the floor with carpet tape.  Make sure that you have a light switch at the  top of the stairs and the bottom of the stairs. If you do not have them, ask someone to add them for you. What else can I do to help prevent falls?  Wear shoes that:  Do not have high heels.  Have rubber bottoms.  Are comfortable and fit you well.  Are closed at the toe. Do not wear sandals.  If you use a stepladder:  Make sure that it is fully opened. Do not climb a closed stepladder.  Make sure that both sides of the stepladder are locked into place.  Ask someone to hold it for you, if possible.  Clearly mark and make sure that you can see:  Any grab bars or handrails.  First and last steps.  Where the edge of each step is.  Use tools that help you move around (mobility aids) if they are needed. These include:  Canes.  Walkers.  Scooters.  Crutches.  Turn on the lights when you go into a dark area. Replace any light bulbs as soon as they burn out.  Set up your furniture so you have a clear path. Avoid moving your furniture around.  If any of your floors are uneven, fix them.  If there are any pets around you, be aware of where they are.  Review your medicines with your doctor. Some medicines can make you feel dizzy. This can increase your chance of falling. Ask your doctor what other things that you can do to help prevent falls. This information is not intended to replace advice given to you by your health care provider. Make sure you discuss any questions you have with your health care provider. Document Released: 11/21/2008 Document Revised: 07/03/2015 Document Reviewed: 03/01/2014 Elsevier Interactive Patient Education  2017 [place] for Adults  A healthy lifestyle and preventive care can promote health and wellness. Preventive health guidelines for adults include the following key practices.  . A routine yearly physical is a good way to check with your health care provider about your health and preventive screening. It is a chance  to share any concerns and updates on your health and to receive a thorough exam.  . Visit your dentist for a routine exam and preventive care every 6 months. Brush your teeth twice a day and floss once a day. Good oral hygiene prevents tooth decay and gum disease.  . The frequency of eye exams is based  on your age, health, family medical history, use  of contact lenses, and other factors. Follow your health care provider's ecommendations for frequency of eye exams.  . Eat a healthy diet. Foods like vegetables, fruits, whole grains, low-fat dairy products, and lean protein foods contain the nutrients you need without too many calories. Decrease your intake of foods high in solid fats, added sugars, and salt. Eat the right amount of calories for you. Get information about a proper diet from your health care provider, if necessary.  . Regular physical exercise is one of the most important things you can do for your health. Most adults should get at least 150 minutes of moderate-intensity exercise (any activity that increases your heart rate and causes you to sweat) each week. In addition, most adults need muscle-strengthening exercises on 2 or more days a week.  Silver Sneakers may be a benefit available to you. To determine eligibility, you may visit the website: www.silversneakers.com or contact program at 3214700837 Mon-Fri between 8AM-8PM.   . Maintain a healthy weight. The body mass index (BMI) is a screening tool to identify possible weight problems. It provides an estimate of body fat based on height and weight. Your health care provider can find your BMI and can help you achieve or maintain a healthy weight.   For adults 20 years and older: ? A BMI below 18.5 is considered underweight. ? A BMI of 18.5 to 24.9 is normal. ? A BMI of 25 to 29.9 is considered overweight. ? A BMI of 30 and above is considered obese.   . Maintain normal blood lipids and cholesterol levels by exercising and  minimizing your intake of saturated fat. Eat a balanced diet with plenty of fruit and vegetables. Blood tests for lipids and cholesterol should begin at age 59 and be repeated every 5 years. If your lipid or cholesterol levels are high, you are over 50, or you are at high risk for heart disease, you may need your cholesterol levels checked more frequently. Ongoing high lipid and cholesterol levels should be treated with medicines if diet and exercise are not working.  . If you smoke, find out from your health care provider how to quit. If you do not use tobacco, please do not start.  . If you choose to drink alcohol, please do not consume more than 2 drinks per day. One drink is considered to be 12 ounces (355 mL) of beer, 5 ounces (148 mL) of wine, or 1.5 ounces (44 mL) of liquor.  . If you are 66-59 years old, ask your health care provider if you should take aspirin to prevent strokes.  . Use sunscreen. Apply sunscreen liberally and repeatedly throughout the day. You should seek shade when your shadow is shorter than you. Protect yourself by wearing long sleeves, pants, a wide-brimmed hat, and sunglasses year round, whenever you are outdoors.  . Once a month, do a whole body skin exam, using a mirror to look at the skin on your back. Tell your health care provider of new moles, moles that have irregular borders, moles that are larger than a pencil eraser, or moles that have changed in shape or color.  Please bring a copy of your POA (Power of [place]) and/or Living Will to your next appointment.

## 2016-12-20 NOTE — Progress Notes (Addendum)
Subjective:   Gina [name] is a 60 y.o. female who presents for Medicare Annual (Subsequent) preventive examination.  Review of Systems:   Cardiac Risk Factors include: hypertension     Objective:     Vitals: BP (!) 146/86 (BP Location: Left Arm, Patient Position: Sitting, Cuff Size: Normal)   Pulse 75   Temp 98.1 F (36.7 C) (Other (Comment))   Resp 16   Ht 5\' 7"  (1.702 m)   Wt 179 lb (81.2 kg)   SpO2 98%   BMI 28.04 kg/m   Body mass index is 28.04 kg/m.   Tobacco Social History   Tobacco Use  Smoking Status Former Smoker  . Packs/day: 1.00  . Years: 6.00  . Pack years: 6.00  . Types: Cigarettes  . Last attempt to quit: 04/28/2010  . Years since quitting: 6.7  Smokeless Tobacco Never Used     Counseling given: Not Answered   Past Medical History:  Diagnosis Date  . Hemorrhoids   . Hyperlipidemia   . Hypertension   . Psychotic disorder [facility]) August 2009   Hopitalised Involuntary commited by daughter   . S/P colonoscopy 03/20/2008   Dr Oneida Alar normal, except hemorrhoids   Past Surgical History:  Procedure Laterality Date  . CHOLECYSTECTOMY  2001  . ECTOPIC PREGNANCY SURGERY  1986   right    Family History  Problem Relation Age of Onset  . Hypertension Mother   . Diabetes Mother   . Kidney disease Mother   . Diabetes Father    Social History   Substance and Sexual Activity  Sexual Activity No    Outpatient Encounter Medications as of 12/20/2016  Medication Sig  . aspirin EC 81 MG tablet Take 1 tablet (81 mg total) by mouth daily.  . Multiple Vitamins-Minerals (MULTIVITAMIN WITH IRON-MINERALS) liquid Take 5 mLs by mouth daily.  . QUEtiapine (SEROQUEL) 25 MG tablet Take 1 tablet (25 mg total) by mouth at bedtime.  . triamterene-hydrochlorothiazide (MAXZIDE-25) 37.5-25 MG tablet Take 1 tablet by mouth daily.  . calcium-vitamin D (OSCAL 500/200 D-3) 500 MG tablet Take 1 tablet by mouth 2 (two) times daily.     No facility-administered  encounter medications on file as of 12/20/2016.     Activities of Daily Living In your present state of health, do you have any difficulty performing the following activities: 12/20/2016  Hearing? N  Vision? N  Difficulty concentrating or making decisions? N  Walking or climbing stairs? N  Dressing or bathing? N  Doing errands, shopping? Y  Preparing Food and eating ? N  Using the Toilet? N  In the past six months, have you accidently leaked urine? N  Do you have problems with loss of bowel control? N  Managing your Medications? N  Managing your Finances? N  Housekeeping or managing your Housekeeping? N  Some recent data might be hidden    Patient Care Team: Fayrene Helper, MD as PCP - [facility], Marga Melnick, MD (Gastroenterology) Rutherford Guys, MD as Consulting Physician (Ophthalmology)    Assessment:     Exercise Activities and Dietary recommendations Current Exercise Habits: Home exercise routine, Type of exercise: strength training/weights;stretching, Time (Minutes): 30, Frequency (Times/Week): 4, Weekly Exercise (Minutes/Week): 120, Intensity: Mild  Goals    None     Fall Risk Fall Risk  12/20/2016 08/19/2015 08/09/2012  Falls in the past year? No No No   Depression Screen PHQ 2/9 Scores 12/20/2016 09/23/2016 09/12/2013 08/09/2012  PHQ - 2 Score 0 0 0  0     Cognitive Function     6CIT Screen 12/20/2016  What Year? 0 points  What month? 0 points  What time? 0 points  Count back from 20 0 points  Months in reverse 0 points  Repeat phrase 0 points  Total Score 0    Immunization History  Administered Date(s) Administered  . H1N1 02/07/2008  . Influenza Split 12/20/2011  . Influenza Whole 11/19/2009  . Influenza,inj,Quad PF,6+ Mos 12/18/2012, 11/19/2013, 01/16/2015, 11/20/2015, 12/20/2016  . Pneumococcal Conjugate-13 09/12/2013  . Td 07/24/2003  . Tdap 12/18/2012   Screening Tests Health Maintenance  Topic Date Due  . COLONOSCOPY  09/04/2017  .  MAMMOGRAM  01/06/2019  . PAP SMEAR  09/25/2019  . TETANUS/TDAP  12/19/2022  . INFLUENZA VACCINE  Completed  . Hepatitis C Screening  Completed  . HIV Screening  Completed      Plan:     I have personally reviewed and noted the following in the patient's chart:   . Medical and social history . Use of alcohol, tobacco or illicit drugs  . Current medications and supplements . Functional ability and status . Nutritional status . Physical activity . Advanced directives . List of other physicians . Hospitalizations, surgeries, and ER visits in previous 12 months . Vitals . Screenings to include cognitive, depression, and falls . Referrals and appointments  In addition, I have reviewed and discussed with patient certain preventive protocols, quality metrics, and best practice recommendations. A written personalized care plan for preventive services as well as general preventive health recommendations were provided to patient.     Merceda Elks, LPN  [id number]

## 2017-01-03 DIAGNOSIS — M79674 Pain in right toe(s): Secondary | ICD-10-CM | POA: Diagnosis not present

## 2017-01-03 DIAGNOSIS — L851 Acquired keratosis [keratoderma] palmaris et plantaris: Secondary | ICD-10-CM | POA: Diagnosis not present

## 2017-01-03 DIAGNOSIS — M79675 Pain in left toe(s): Secondary | ICD-10-CM | POA: Diagnosis not present

## 2017-01-03 DIAGNOSIS — B351 Tinea unguium: Secondary | ICD-10-CM | POA: Diagnosis not present

## 2017-01-03 DIAGNOSIS — I739 Peripheral vascular disease, unspecified: Secondary | ICD-10-CM | POA: Diagnosis not present

## 2017-01-05 ENCOUNTER — Encounter (HOSPITAL_COMMUNITY): Payer: Self-pay

## 2017-01-05 ENCOUNTER — Ambulatory Visit (HOSPITAL_COMMUNITY)
Admission: RE | Admit: 2017-01-05 | Discharge: 2017-01-05 | Disposition: A | Discharge status: Home / Self Care | Payer: Medicare Other | Source: Ambulatory Visit | Attending: Family Medicine | Admitting: Family Medicine

## 2017-01-05 DIAGNOSIS — Z1231 Encounter for screening mammogram for malignant neoplasm of breast: Secondary | ICD-10-CM | POA: Diagnosis not present

## 2017-03-02 ENCOUNTER — Encounter: Payer: Self-pay | Admitting: Family Medicine

## 2017-03-03 ENCOUNTER — Other Ambulatory Visit: Payer: Self-pay | Admitting: Family Medicine

## 2017-03-03 MED ORDER — QUETIAPINE FUMARATE 25 MG PO TABS: 25.0000 mg | ORAL_TABLET | Freq: Every day | ORAL | 11 refills | Status: DC

## 2017-03-14 DIAGNOSIS — M79675 Pain in left toe(s): Secondary | ICD-10-CM | POA: Diagnosis not present

## 2017-03-14 DIAGNOSIS — L851 Acquired keratosis [keratoderma] palmaris et plantaris: Secondary | ICD-10-CM | POA: Diagnosis not present

## 2017-03-14 DIAGNOSIS — I739 Peripheral vascular disease, unspecified: Secondary | ICD-10-CM | POA: Diagnosis not present

## 2017-03-14 DIAGNOSIS — B351 Tinea unguium: Secondary | ICD-10-CM | POA: Diagnosis not present

## 2017-03-14 DIAGNOSIS — M79674 Pain in right toe(s): Secondary | ICD-10-CM | POA: Diagnosis not present

## 2017-03-29 ENCOUNTER — Ambulatory Visit: Payer: Medicare Other | Admitting: Family Medicine

## 2017-04-04 ENCOUNTER — Encounter: Payer: Self-pay | Admitting: Family Medicine

## 2017-04-04 ENCOUNTER — Ambulatory Visit (INDEPENDENT_AMBULATORY_CARE_PROVIDER_SITE_OTHER): Payer: Medicare Other | Admitting: Family Medicine

## 2017-04-04 ENCOUNTER — Ambulatory Visit: Payer: Medicare Other | Admitting: Family Medicine

## 2017-04-04 VITALS — BP 140/84 | HR 71 | Resp 16 | Ht 67.0 in | Wt 168.1 lb

## 2017-04-04 DIAGNOSIS — I10 Essential (primary) hypertension: Secondary | ICD-10-CM | POA: Diagnosis not present

## 2017-04-04 DIAGNOSIS — E785 Hyperlipidemia, unspecified: Secondary | ICD-10-CM | POA: Diagnosis not present

## 2017-04-04 DIAGNOSIS — F29 Unspecified psychosis not due to a substance or known physiological condition: Secondary | ICD-10-CM | POA: Diagnosis not present

## 2017-04-04 MED ORDER — QUETIAPINE FUMARATE 25 MG PO TABS: 25.0000 mg | ORAL_TABLET | Freq: Every day | ORAL | 11 refills | Status: DC

## 2017-04-04 MED ORDER — TRIAMTERENE-HCTZ 37.5-25 MG PO TABS: 1.0000 | ORAL_TABLET | Freq: Every day | ORAL | 11 refills | Status: DC

## 2017-04-04 NOTE — Patient Instructions (Addendum)
F/u with rectal August 23 or after, call if you need me sooner  Please take your bP med, every morning and sleep  med every night   Fasting cBC, lipid, cmp and EGFr, TSH and vit D August 16 or after  Thank you  for choosing [facility]. We consider it a privelige to serve you.  Delivering excellent health care in a caring and  compassionate way is our goal.  Partnering with you,  so that together we can achieve this goal is our strategy.

## 2017-04-05 NOTE — Assessment & Plan Note (Signed)
Hyperlipidemia:Low fat diet discussed and encouraged.   Lipid Panel  Lab Results  Component Value Date   CHOL 193 09/23/2016   HDL 70 09/23/2016   LDLCALC 114 (H) 09/23/2016   TRIG 43 09/23/2016   CHOLHDL 2.8 09/23/2016

## 2017-04-05 NOTE — Assessment & Plan Note (Signed)
Controlled, no change in medication DASH diet and commitment to daily physical activity for a minimum of 30 minutes discussed and encouraged, as a part of hypertension management. The importance of attaining a healthy weight is also discussed.  BP/Weight 04/04/2017 12/20/2016 09/23/2016 11/20/2015 08/19/2015 05/09/2014 96/05/5407  Systolic BP 811 914 782 956 213 086 578  Diastolic BP 84 86 80 84 88 74 62  Wt. (Lbs) 168.12 179 170 165 165 152 144.04  BMI 26.33 28.04 26.63 25.84 25.84 23.8 22.55

## 2017-04-05 NOTE — Assessment & Plan Note (Signed)
Controlled, no change in medication  

## 2017-04-05 NOTE — Progress Notes (Signed)
   Emily Massar     MRN: [id number]      DOB: 09-03-1955   HPI Ms. Harmon is here for follow up and re-evaluation of chronic medical conditions, medication management and review of any available recent lab and radiology data.  Preventive health is updated, specifically  Cancer screening and Immunization.    The PT denies any adverse reactions to current medications since the last visit.  There are no new concerns.  There are no specific complaints   ROS Denies recent fever or chills. Denies sinus pressure, nasal congestion, ear pain or sore throat. Denies chest congestion, productive cough or wheezing. Denies chest pains, palpitations and leg swelling Denies abdominal pain, nausea, vomiting,diarrhea or constipation.   Denies dysuria, frequency, hesitancy or incontinence. Denies joint pain, swelling and limitation in mobility. Denies headaches, seizures, numbness, or tingling.    PE  BP 140/84   Pulse 71   Resp 16   Ht 5\' 7"  (1.702 m)   Wt 168 lb 1.9 oz (76.3 kg)   SpO2 96%   BMI 26.33 kg/m   Patient alert and oriented and in no cardiopulmonary distress.  HEENT: No facial asymmetry, EOMI,   oropharynx pink and moist.  Neck supple no JVD, no mass.  Chest: Clear to auscultation bilaterally.  CVS: S1, S2 no murmurs, no S3.Regular rate.  ABD: Soft non tender.   Ext: No edema  MS: Adequate ROM spine, shoulders, hips and knees.  Skin: Intact, no ulcerations or rash noted.  Psych: Good eye contact, not anxious or depressed appearing.  CNS: CN 2-12 intact, power,  normal throughout.no focal deficits noted.   Assessment & Plan  Essential hypertension Controlled, no change in medication DASH diet and commitment to daily physical activity for a minimum of 30 minutes discussed and encouraged, as a part of hypertension management. The importance of attaining a healthy weight is also discussed.  BP/Weight 04/04/2017 12/20/2016 09/23/2016 11/20/2015 08/19/2015  05/09/2014 04/88/8916  Systolic BP 945 038 882 800 349 179 150  Diastolic BP 84 86 80 84 88 74 62  Wt. (Lbs) 168.12 179 170 165 165 152 144.04  BMI 26.33 28.04 26.63 25.84 25.84 23.8 22.55       Psychotic disorder with delusions Controlled, no change in medication   Dyslipidemia Hyperlipidemia:Low fat diet discussed and encouraged.   Lipid Panel  Lab Results  Component Value Date   CHOL 193 09/23/2016   HDL 70 09/23/2016   LDLCALC 114 (H) 09/23/2016   TRIG 43 09/23/2016   CHOLHDL 2.8 09/23/2016

## 2017-04-26 ENCOUNTER — Telehealth: Payer: Self-pay

## 2017-04-26 DIAGNOSIS — I10 Essential (primary) hypertension: Secondary | ICD-10-CM

## 2017-04-26 DIAGNOSIS — E785 Hyperlipidemia, unspecified: Secondary | ICD-10-CM

## 2017-04-26 DIAGNOSIS — E559 Vitamin D deficiency, unspecified: Secondary | ICD-10-CM

## 2017-04-26 NOTE — Telephone Encounter (Signed)
Labs sent

## 2017-05-23 DIAGNOSIS — I739 Peripheral vascular disease, unspecified: Secondary | ICD-10-CM | POA: Diagnosis not present

## 2017-05-23 DIAGNOSIS — B351 Tinea unguium: Secondary | ICD-10-CM | POA: Diagnosis not present

## 2017-05-23 DIAGNOSIS — M79675 Pain in left toe(s): Secondary | ICD-10-CM | POA: Diagnosis not present

## 2017-05-23 DIAGNOSIS — M79674 Pain in right toe(s): Secondary | ICD-10-CM | POA: Diagnosis not present

## 2017-05-30 ENCOUNTER — Encounter: Payer: Self-pay | Admitting: Family Medicine

## 2017-07-01 DIAGNOSIS — F29 Unspecified psychosis not due to a substance or known physiological condition: Secondary | ICD-10-CM

## 2017-08-01 DIAGNOSIS — M79674 Pain in right toe(s): Secondary | ICD-10-CM | POA: Diagnosis not present

## 2017-08-01 DIAGNOSIS — M79675 Pain in left toe(s): Secondary | ICD-10-CM | POA: Diagnosis not present

## 2017-08-01 DIAGNOSIS — B351 Tinea unguium: Secondary | ICD-10-CM | POA: Diagnosis not present

## 2017-08-01 DIAGNOSIS — I739 Peripheral vascular disease, unspecified: Secondary | ICD-10-CM | POA: Diagnosis not present

## 2017-10-05 ENCOUNTER — Ambulatory Visit: Payer: Medicare Other | Admitting: Family Medicine

## 2017-10-12 DIAGNOSIS — M79674 Pain in right toe(s): Secondary | ICD-10-CM | POA: Diagnosis not present

## 2017-10-12 DIAGNOSIS — M79675 Pain in left toe(s): Secondary | ICD-10-CM | POA: Diagnosis not present

## 2017-10-12 DIAGNOSIS — B351 Tinea unguium: Secondary | ICD-10-CM | POA: Diagnosis not present

## 2017-10-12 DIAGNOSIS — I739 Peripheral vascular disease, unspecified: Secondary | ICD-10-CM | POA: Diagnosis not present

## 2017-10-13 ENCOUNTER — Encounter: Payer: Self-pay | Admitting: Family Medicine

## 2017-10-13 ENCOUNTER — Ambulatory Visit (INDEPENDENT_AMBULATORY_CARE_PROVIDER_SITE_OTHER): Payer: Medicare Other | Admitting: Family Medicine

## 2017-10-13 VITALS — BP 134/82 | HR 82 | Resp 12 | Ht 67.0 in | Wt 166.1 lb

## 2017-10-13 DIAGNOSIS — E785 Hyperlipidemia, unspecified: Secondary | ICD-10-CM | POA: Diagnosis not present

## 2017-10-13 DIAGNOSIS — E559 Vitamin D deficiency, unspecified: Secondary | ICD-10-CM

## 2017-10-13 DIAGNOSIS — Z23 Encounter for immunization: Secondary | ICD-10-CM

## 2017-10-13 DIAGNOSIS — I10 Essential (primary) hypertension: Secondary | ICD-10-CM | POA: Diagnosis not present

## 2017-10-13 DIAGNOSIS — F29 Unspecified psychosis not due to a substance or known physiological condition: Secondary | ICD-10-CM | POA: Diagnosis not present

## 2017-10-13 DIAGNOSIS — Z1231 Encounter for screening mammogram for malignant neoplasm of breast: Secondary | ICD-10-CM | POA: Diagnosis not present

## 2017-10-13 DIAGNOSIS — Z1211 Encounter for screening for malignant neoplasm of colon: Secondary | ICD-10-CM

## 2017-10-13 NOTE — Patient Instructions (Signed)
Wellness with nurse due Novemeber 20 or after , please schedule afternoon appt after 3 if possible  MD follow up end January , call if you need me sooner  Flu vaccine today.  Please get all labs fasting this Saturday at [place] need a colonoscopy I will refer you and let your daughter know  Mammogram due in Novemebr pls schedule at checkout    I will let daughter know you are not taking seroquel

## 2017-10-14 NOTE — Progress Notes (Signed)
   Trust Crago     MRN: [id number]      DOB: [birth year]   HPI Ms. Ellerman is here for follow up and re-evaluation of chronic medical conditions, medication management and review of any available recent lab and radiology data.  Preventive health is updated, specifically  Cancer screening and Immunization.   The PT denies any adverse reactions to current medications since the last visit. He has discontinued seroquel, howeve , no concerns have been expressed about behavioral issuesThere are no new concerns.  There are no specific complaints   ROS Denies recent fever or chills. Denies sinus pressure, nasal congestion, ear pain or sore throat. Denies chest congestion, productive cough or wheezing. Denies chest pains, palpitations and leg swelling Denies abdominal pain, nausea, vomiting,diarrhea or constipation.   Denies dysuria, frequency, hesitancy or incontinence. Denies joint pain, swelling and limitation in mobility. Denies headaches, seizures, numbness, or tingling. Denies depression, anxiety or insomnia. Denies skin break down or rash.   PE  BP 134/82   Pulse 82   Resp 12   Ht 5\' 7"  (1.702 m)   Wt 166 lb 1.9 oz (75.4 kg)   SpO2 97%   BMI 26.02 kg/m   Patient alert and oriented and in no cardiopulmonary distress.  HEENT: No facial asymmetry, EOMI,   oropharynx pink and moist.  Neck supple no JVD, no mass.  Chest: Clear to auscultation bilaterally.  CVS: S1, S2 no murmurs, no S3.Regular rate.  ABD: Soft non tender.   Ext: No edema  MS: Adequate ROM spine, shoulders, hips and knees.  Skin: Intact, no ulcerations or rash noted.  Psych: Good eye contact,blunted  affect.  not anxious or depressed appearing.  CNS: CN 2-12 intact, power,  normal throughout.no focal deficits noted.   Assessment & Plan  Essential hypertension Controlled, no change in medication DASH diet and commitment to daily physical activity for a minimum of 30 minutes discussed and  encouraged, as a part of hypertension management. The importance of attaining a healthy weight is also discussed.  BP/Weight 10/13/2017 04/04/2017 12/20/2016 09/23/2016 11/20/2015 08/19/2015 06/04/8339  Systolic BP 962 229 798 921 194 174 081  Diastolic BP 82 84 86 80 84 88 74  Wt. (Lbs) 166.12 168.12 179 170 165 165 152  BMI 26.02 26.33 28.04 26.63 25.84 25.84 23.8       Dyslipidemia Hyperlipidemia:Low fat diet discussed and encouraged.   Lipid Panel  Lab Results  Component Value Date   CHOL 193 09/23/2016   HDL 70 09/23/2016   LDLCALC 114 (H) 09/23/2016   TRIG 43 09/23/2016   CHOLHDL 2.8 09/23/2016   Updated lab needed      Psychotic disorder with delusions Stable off of medication, pt voluntarily discontinued the medication, no reports of behavioral challenges have been received from her daughter, however I will make her aware , lady who transports states that she is keeping her home clean

## 2017-10-15 ENCOUNTER — Encounter: Payer: Self-pay | Admitting: Family Medicine

## 2017-10-15 DIAGNOSIS — E559 Vitamin D deficiency, unspecified: Secondary | ICD-10-CM | POA: Diagnosis not present

## 2017-10-15 DIAGNOSIS — E785 Hyperlipidemia, unspecified: Secondary | ICD-10-CM | POA: Diagnosis not present

## 2017-10-15 DIAGNOSIS — I10 Essential (primary) hypertension: Secondary | ICD-10-CM | POA: Diagnosis not present

## 2017-10-15 NOTE — Assessment & Plan Note (Signed)
Hyperlipidemia:Low fat diet discussed and encouraged.   Lipid Panel  Lab Results  Component Value Date   CHOL 193 09/23/2016   HDL 70 09/23/2016   LDLCALC 114 (H) 09/23/2016   TRIG 43 09/23/2016   CHOLHDL 2.8 09/23/2016   Updated lab needed

## 2017-10-15 NOTE — Assessment & Plan Note (Signed)
Controlled, no change in medication DASH diet and commitment to daily physical activity for a minimum of 30 minutes discussed and encouraged, as a part of hypertension management. The importance of attaining a healthy weight is also discussed.  BP/Weight 10/13/2017 04/04/2017 12/20/2016 09/23/2016 11/20/2015 08/19/2015 09/04/6182  Systolic BP 859 276 394 320 037 944 461  Diastolic BP 82 84 86 80 84 88 74  Wt. (Lbs) 166.12 168.12 179 170 165 165 152  BMI 26.02 26.33 28.04 26.63 25.84 25.84 23.8

## 2017-10-15 NOTE — Assessment & Plan Note (Signed)
Stable off of medication, pt voluntarily discontinued the medication, no reports of behavioral challenges have been received from her daughter, however I will make her aware , lady who transports states that she is keeping her home clean

## 2017-10-18 ENCOUNTER — Encounter: Payer: Self-pay | Admitting: Family Medicine

## 2017-10-18 LAB — CMP14+EGFR
ALBUMIN: 4.5 g/dL (ref 3.6–4.8)
ALK PHOS: 58 IU/L (ref 39–117)
ALT: 24 IU/L (ref 0–32)
AST: 25 IU/L (ref 0–40)
Albumin/Globulin Ratio: 1.4 (ref 1.2–2.2)
BUN / CREAT RATIO: 21 (ref 12–28)
BUN: 19 mg/dL (ref 8–27)
Bilirubin Total: 0.6 mg/dL (ref 0.0–1.2)
CALCIUM: 9.7 mg/dL (ref 8.7–10.3)
CO2: 26 mmol/L (ref 20–29)
CREATININE: 0.91 mg/dL (ref 0.57–1.00)
Chloride: 101 mmol/L (ref 96–106)
GFR, EST AFRICAN AMERICAN: 78 mL/min/{1.73_m2} (ref >59)
GFR, EST NON AFRICAN AMERICAN: 67 mL/min/{1.73_m2} (ref >59)
GLOBULIN, TOTAL: 3.2 g/dL (ref 1.5–4.5)
Glucose: 81 mg/dL (ref 65–99)
Potassium: 3.5 mmol/L (ref 3.5–5.2)
SODIUM: 142 mmol/L (ref 134–144)
TOTAL PROTEIN: 7.7 g/dL (ref 6.0–8.5)

## 2017-10-18 LAB — CBC
HEMOGLOBIN: 12.9 g/dL (ref 11.1–15.9)
Hematocrit: 40.4 % (ref 34.0–46.6)
MCH: 26.8 pg (ref 26.6–33.0)
MCHC: 31.9 g/dL (ref 31.5–35.7)
MCV: 84 fL (ref 79–97)
PLATELETS: 218 10*3/uL (ref 150–450)
RBC: 4.81 x10E6/uL (ref 3.77–5.28)
RDW: 13.9 % (ref 12.3–15.4)
WBC: 4.9 10*3/uL (ref 3.4–10.8)

## 2017-10-18 LAB — LIPID PANEL
CHOL/HDL RATIO: 3.3 ratio (ref 0.0–4.4)
Cholesterol, Total: 224 mg/dL — ABNORMAL HIGH (ref 100–199)
HDL: 67 mg/dL (ref >39)
LDL Calculated: 148 mg/dL — ABNORMAL HIGH (ref 0–99)
Triglycerides: 44 mg/dL (ref 0–149)
VLDL Cholesterol Cal: 9 mg/dL (ref 5–40)

## 2017-10-18 LAB — VITAMIN D 25 HYDROXY (VIT D DEFICIENCY, FRACTURES): VIT D 25 HYDROXY: 26.3 ng/mL — AB (ref 30.0–100.0)

## 2017-10-18 LAB — TSH: TSH: 1.92 u[IU]/mL (ref 0.450–4.500)

## 2017-11-07 ENCOUNTER — Encounter: Payer: Self-pay | Admitting: Gastroenterology

## 2017-11-23 ENCOUNTER — Ambulatory Visit: Payer: Medicare Other

## 2017-12-07 ENCOUNTER — Ambulatory Visit (INDEPENDENT_AMBULATORY_CARE_PROVIDER_SITE_OTHER): Payer: Self-pay

## 2017-12-07 DIAGNOSIS — Z1211 Encounter for screening for malignant neoplasm of colon: Secondary | ICD-10-CM

## 2017-12-07 MED ORDER — NA SULFATE-K SULFATE-MG SULF 17.5-3.13-1.6 GM/177ML PO SOLN: 1.0000 | ORAL | 0 refills | Status: DC

## 2017-12-07 NOTE — Progress Notes (Signed)
Gastroenterology Pre-Procedure Review  Request Date:12/07/17 Requesting Physician: 10 year recall last tcs- [facility] 03/20/08 no polyps  PATIENT REVIEW QUESTIONS: The patient responded to the following health history questions as indicated:    1. Diabetes Melitis: no 2. Joint replacements in the past 12 months: no 3. Major health problems in the past 3 months: no 4. Has an artificial valve or MVP: no 5. Has a defibrillator: no 6. Has been advised in past to take antibiotics in advance of a procedure like teeth cleaning: no 7. Family history of colon cancer: no  8. Alcohol Use: no 9. History of sleep apnea: no  10. History of coronary artery or other vascular stents placed within the last 12 months: no 11. History of any prior anesthesia complications: no    MEDICATIONS & ALLERGIES:    Patient reports the following regarding taking any blood thinners:   Plavix? no Aspirin? no Coumadin? no Brilinta? no Xarelto? no Eliquis? no Pradaxa? no Savaysa? no Effient? no  Patient confirms/reports the following medications:  Current Outpatient Medications  Medication Sig Dispense Refill  . calcium-vitamin D (OSCAL 500/200 D-3) 500 MG tablet Take 1 tablet by mouth 2 (two) times daily.      . ciclopirox (PENLAC) 8 % solution Apply topically at bedtime. Apply over nail and surrounding skin. Apply daily over previous coat. After seven (7) days, may remove with alcohol and continue cycle.    . triamterene-hydrochlorothiazide (MAXZIDE-25) 37.5-25 MG tablet Take 1 tablet by mouth daily. 30 tablet 11   No current facility-administered medications for this visit.     Patient confirms/reports the following allergies:  Allergies  Allergen Reactions  . Ciprofloxacin Swelling    Facial swelling and redness   . Penicillins Other (See Comments)    msg from pt's pharmacy on 6/28  . Prednisone     No orders of the defined types were placed in this encounter.   AUTHORIZATION INFORMATION Primary  Insurance: medicare,  ID #: [id number] Pre-Cert / Auth required: no    SCHEDULE INFORMATION: Procedure has been scheduled as follows:  Date: 02/06/18, Time: 1:15  Location: [facility]  This Gastroenterology Pre-Precedure Review Form is being routed to the following provider(s): Roseanne Kaufman NP

## 2017-12-07 NOTE — Patient Instructions (Addendum)
[name]   16-Nov-1955 MRN: [id number]    Procedure Date: 02/06/18 Time to register: 12:15pm Place to register: [facility] Procedure Time: 1:15pm Scheduled provider: Barney Drain, MD  PREPARATION FOR COLONOSCOPY WITH TRI-LYTE SPLIT PREP  Please notify us immediately if you are diabetic, take iron supplements, or if you are on Coumadin or any other blood thinners.     You will need to purchase 1 fleet enema and 1 box of Bisacodyl 69m tablets. These are available over the counter at your pharmacy.    1 DAY BEFORE PROCEDURE:  DATE: 02/05/18   DAY: Sunday  clear liquids the entire day - NO SOLID FOOD.    At 2:00 pm:  Take 2 Bisacodyl tablets.   At 4:00pm:  Start drinking your solution. Make sure you mix well per instructions on the bottle. Try to drink 1 (one) 8 ounce glass every 10-15 minutes until you have consumed HALF the jug. You should complete by 6:00pm.You must keep the left over solution refrigerated until completed next day.  Continue clear liquids. You must drink plenty of clear liquids to prevent dehyration and kidney failure.     DAY OF PROCEDURE:   DATE: 02/06/18   DAY: Monday If you take medications for your heart, blood pressure or breathing, you may take these medications.   Five hours before your procedure time @ 8:15am:  Finish remaining amout of bowel prep, drinking 1 (one) 8 ounce glass every 10-15 minutes until complete. You have two hours to consume remaining prep.   Three hours before your procedure time _0 :15am:  Nothing by mouth.   At least one hour before going to the hospital:  Give yourself one Fleet enema. You may take your morning medications with sip of water unless we have instructed otherwise.      Please see below for Dietary Information.  CLEAR LIQUIDS INCLUDE:  Water Jello (NOT red in color)   Ice Popsicles (NOT red in color)   Tea (sugar ok, no milk/cream) Powdered fruit flavored drinks  Coffee (sugar ok, no  milk/cream) Gatorade/ Lemonade/ Kool-Aid  (NOT red in color)   Juice: apple, white grape, white cranberry Soft drinks  Clear bullion, consomme, broth (fat free beef/chicken/vegetable)  Carbonated beverages (any kind)  Strained chicken noodle soup Hard Candy   Remember: Clear liquids are liquids that will allow you to see your fingers on the other side of a clear glass. Be sure liquids are NOT red in color, and not cloudy, but CLEAR.  DO NOT EAT OR DRINK ANY OF THE FOLLOWING:  Dairy products of any kind   Cranberry juice Tomato juice / V8 juice   Grapefruit juice Orange juice     Red grape juice  Do not eat any solid foods, including such foods as: cereal, oatmeal, yogurt, fruits, vegetables, creamed soups, eggs, bread, crackers, pureed foods in a blender, etc.   HELPFUL HINTS FOR DRINKING PREP SOLUTION:   Make sure prep is extremely cold. Mix and refrigerate the the morning of the prep. You may also put in the freezer.   You may try mixing some Crystal Light or Country Time Lemonade if you prefer. Mix in small amounts; add more if necessary.  Try drinking through a straw  Rinse mouth with water or a mouthwash between glasses, to remove after-taste.  Try sipping on a cold beverage /ice/ popsicles between glasses of prep.  Place a piece of sugar-free hard candy in mouth between glasses.  If you become nauseated,  try consuming smaller amounts, or stretch out the time between glasses. Stop for 30-60 minutes, then slowly start back drinking.        OTHER INSTRUCTIONS  You will need a responsible adult at least 63 years of age to accompany you and drive you home. This person must remain in the waiting room during your procedure. The hospital will cancel your procedure if you do not have a responsible adult with you.   1. Wear loose fitting clothing that is easily removed. 2. Leave jewelry and other valuables at home.  3. Remove all body piercing jewelry and leave at  home. 4. Total time from sign-in until discharge is approximately 2-3 hours. 5. You should go home directly after your procedure and rest. You can resume normal activities the day after your procedure. 6. The day of your procedure you should not:  Drive  Make legal decisions  Operate machinery  Drink alcohol  Return to work   You may call the office (Dept: [phone]) before 5:00pm, or page the doctor on call [phone]) after 5:00pm, for further instructions, if necessary.   Insurance Information YOU WILL NEED TO CHECK WITH YOUR INSURANCE COMPANY FOR THE BENEFITS OF COVERAGE YOU HAVE FOR THIS PROCEDURE.  UNFORTUNATELY, NOT ALL INSURANCE COMPANIES HAVE BENEFITS TO COVER ALL OR PART OF THESE TYPES OF PROCEDURES.  IT IS YOUR RESPONSIBILITY TO CHECK YOUR BENEFITS, HOWEVER, WE WILL BE GLAD TO ASSIST YOU WITH ANY CODES YOUR INSURANCE COMPANY MAY NEED.    PLEASE NOTE THAT MOST INSURANCE COMPANIES WILL NOT COVER A SCREENING COLONOSCOPY FOR PEOPLE UNDER THE AGE OF 50  IF YOU HAVE BCBS INSURANCE, YOU MAY HAVE BENEFITS FOR A SCREENING COLONOSCOPY BUT IF POLYPS ARE FOUND THE DIAGNOSIS WILL CHANGE AND THEN YOU MAY HAVE A DEDUCTIBLE THAT WILL NEED TO BE MET. SO PLEASE MAKE SURE YOU CHECK YOUR BENEFITS FOR A SCREENING COLONOSCOPY AS WELL AS A DIAGNOSTIC COLONOSCOPY.

## 2017-12-12 NOTE — Progress Notes (Signed)
Appropriate.

## 2017-12-15 ENCOUNTER — Telehealth: Payer: Self-pay | Admitting: Gastroenterology

## 2017-12-15 NOTE — Telephone Encounter (Signed)
noted 

## 2017-12-15 NOTE — Telephone Encounter (Signed)
Patient called to let you know that her medicine she is taking is a cream for her feet. Not oral, said she was supposed to let you know.  Urea cream

## 2017-12-26 DIAGNOSIS — B351 Tinea unguium: Secondary | ICD-10-CM | POA: Diagnosis not present

## 2017-12-26 DIAGNOSIS — I739 Peripheral vascular disease, unspecified: Secondary | ICD-10-CM | POA: Diagnosis not present

## 2017-12-26 DIAGNOSIS — M79674 Pain in right toe(s): Secondary | ICD-10-CM | POA: Diagnosis not present

## 2017-12-26 DIAGNOSIS — M79675 Pain in left toe(s): Secondary | ICD-10-CM | POA: Diagnosis not present

## 2018-01-02 ENCOUNTER — Ambulatory Visit: Payer: Medicare Other

## 2018-01-02 ENCOUNTER — Encounter: Payer: Self-pay | Admitting: Family Medicine

## 2018-01-03 ENCOUNTER — Ambulatory Visit (INDEPENDENT_AMBULATORY_CARE_PROVIDER_SITE_OTHER): Payer: Medicare Other

## 2018-01-03 ENCOUNTER — Other Ambulatory Visit: Payer: Self-pay | Admitting: Family Medicine

## 2018-01-03 VITALS — BP 150/81 | HR 65 | Resp 10 | Ht 67.0 in | Wt 169.0 lb

## 2018-01-03 DIAGNOSIS — Z0000 Encounter for general adult medical examination without abnormal findings: Secondary | ICD-10-CM | POA: Diagnosis not present

## 2018-01-03 MED ORDER — QUETIAPINE FUMARATE 25 MG PO TABS: 25.0000 mg | ORAL_TABLET | Freq: Every day | ORAL | 5 refills | Status: DC

## 2018-01-03 NOTE — Progress Notes (Signed)
Subjective:   Gina Black is a 61 y.o. female who presents for Medicare Annual (Subsequent) preventive examination.  Review of Systems: Cardiac Risk Factors include: obesity (BMI >30kg/m2);hypertension;dyslipidemia     Objective:     Vitals: BP (!) 150/81   Pulse 65   Resp 10   Ht 5' 7"  (1.702 m)   Wt 169 lb (76.7 kg)   SpO2 100%   BMI 26.47 kg/m   Body mass index is 26.47 kg/m.  Advanced Directives 01/03/2018 12/20/2016  Does Patient Have a Medical Advance Directive? No No  Would patient like information on creating a medical advance directive? Yes (ED - Information included in AVS) Yes (MAU/Ambulatory/Procedural Areas - Information given)    Tobacco Social History   Tobacco Use  Smoking Status Former Smoker  . Packs/day: 1.00  . Years: 6.00  . Pack years: 6.00  . Types: Cigarettes  . Last attempt to quit: 04/28/2010  . Years since quitting: 7.6  Smokeless Tobacco Never Used     Counseling given: Not Answered   Clinical Intake:  Pre-visit preparation completed: Yes  Pain : No/denies pain Pain Score: 0-No pain     BMI - recorded: 26.5 Nutritional Status: BMI 25 -29 Overweight Nutritional Risks: None Diabetes: No  How often do you need to have someone help you when you read instructions, pamphlets, or other written materials from your doctor or pharmacy?: 1 - Never What is the last grade level you completed in school?: 12 grade   Interpreter Needed?: No  Information entered by :: Darnelle Maffucci LPN   Past Medical History:  Diagnosis Date  . Hemorrhoids   . Hyperlipidemia   . Hypertension   . Psychotic disorder [facility]) August 2009   Hopitalised Involuntary commited by daughter   . S/P colonoscopy 03/20/2008   Dr Oneida Alar normal, except hemorrhoids   Past Surgical History:  Procedure Laterality Date  . CHOLECYSTECTOMY  2001  . ECTOPIC PREGNANCY SURGERY  1986   right    Family History  Problem Relation Age of Onset  . Hypertension Mother    . Diabetes Mother   . Kidney disease Mother   . Diabetes Father    Social History   Socioeconomic History  . Marital status: Widowed    Spouse name: Not on file  . Number of children: 1  . Years of education: 16  . Highest education level: 12th grade  Occupational History  . Occupation: disabled    Comment: previously Civil engineer, contracting  . Financial resource strain: Not hard at all  . Food insecurity:    Worry: Never true    Inability: Never true  . Transportation needs:    Medical: No    Non-medical: No  Tobacco Use  . Smoking status: Former Smoker    Packs/day: 1.00    Years: 6.00    Pack years: 6.00    Types: Cigarettes    Last attempt to quit: 04/28/2010    Years since quitting: 7.6  . Smokeless tobacco: Never Used  Substance and Sexual Activity  . Alcohol use: No  . Drug use: No  . Sexual activity: Not Currently  Lifestyle  . Physical activity:    Days per week: 4 days    Minutes per session: 30 min  . Stress: Not at all  Relationships  . Social connections:    Talks on phone: More than three times a week    Gets together: More than three times a week  Attends religious service: Never    Active member of club or organization: No    Attends meetings of clubs or organizations: Never    Relationship status: Widowed  Other Topics Concern  . Not on file  Social History Narrative   Lives alone.  Daughter in [place].    Outpatient Encounter Medications as of 01/03/2018  Medication Sig  . calcium-vitamin D (OSCAL 500/200 D-3) 500 MG tablet Take 1 tablet by mouth 2 (two) times daily.    . ciclopirox (PENLAC) 8 % solution Apply topically at bedtime. Apply over nail and surrounding skin. Apply daily over previous coat. After seven (7) days, may remove with alcohol and continue cycle.  . triamterene-hydrochlorothiazide (MAXZIDE-25) 37.5-25 MG tablet Take 1 tablet by mouth daily.  . Na Sulfate-K Sulfate-Mg Sulf (SUPREP BOWEL PREP KIT) 17.5-3.13-1.6  GM/177ML SOLN Take 1 kit by mouth as directed. (Patient not taking: Reported on 01/03/2018)  . QUEtiapine (SEROQUEL) 25 MG tablet Take 1 tablet (25 mg total) by mouth at bedtime. (Patient not taking: Reported on 01/03/2018)   No facility-administered encounter medications on file as of 01/03/2018.     Activities of Daily Living In your present state of health, do you have any difficulty performing the following activities: 01/03/2018  Hearing? N  Vision? Y  Difficulty concentrating or making decisions? N  Walking or climbing stairs? N  Dressing or bathing? N  Doing errands, shopping? Y  Preparing Food and eating ? N  Using the Toilet? N  In the past six months, have you accidently leaked urine? N  Do you have problems with loss of bowel control? N  Managing your Medications? Y  Managing your Finances? Y  Housekeeping or managing your Housekeeping? N  Some recent data might be hidden    Patient Care Team: Fayrene Helper, MD as PCP - General Danie Binder, MD (Gastroenterology) Rutherford Guys, MD as Consulting Physician (Ophthalmology)    Assessment:   This is a routine wellness examination for Gina Black.  Exercise Activities and Dietary recommendations Current Exercise Habits: Home exercise routine, Type of exercise: stretching;walking, Time (Minutes): 30, Frequency (Times/Week): 4, Weekly Exercise (Minutes/Week): 120, Intensity: Mild, Exercise limited by: psychological condition(s)  Goals    . DIET - INCREASE WATER INTAKE       Fall Risk Fall Risk  01/03/2018 12/20/2016 08/19/2015 08/09/2012  Falls in the past year? 0 No No No   Is the patient's home free of loose throw rugs in walkways, pet beds, electrical cords, etc?   yes      Grab bars in the bathroom? no      Handrails on the stairs?   yes      Adequate lighting?   yes  Timed Get Up and Go performed: Patient able to perform In 5 seconds without assistance   Depression Screen PHQ 2/9 Scores 01/03/2018 10/13/2017  12/20/2016 09/23/2016  PHQ - 2 Score 0 0 0 0     Cognitive Function     6CIT Screen 01/03/2018 12/20/2016  What Year? 0 points 0 points  What month? 0 points 0 points  What time? 0 points 0 points  Count back from 20 0 points 0 points  Months in reverse 0 points 0 points  Repeat phrase 0 points 0 points  Total Score 0 0    Immunization History  Administered Date(s) Administered  . H1N1 02/07/2008  . Influenza Split 12/20/2011  . Influenza Whole 11/19/2009  . Influenza,inj,Quad PF,6+ Mos 12/18/2012, 11/19/2013, 01/16/2015, 11/20/2015, 12/20/2016,  10/13/2017  . Pneumococcal Conjugate-13 09/12/2013  . Td 07/24/2003  . Tdap 12/18/2012    Qualifies for Shingles Vaccine?postponed   Screening Tests Health Maintenance  Topic Date Due  . COLONOSCOPY  09/04/2017  . MAMMOGRAM  01/06/2019  . PAP SMEAR  09/25/2019  . TETANUS/TDAP  12/19/2022  . INFLUENZA VACCINE  Completed  . Hepatitis C Screening  Completed  . HIV Screening  Completed    Cancer Screenings: Lung: Low Dose CT Chest recommended if Age 40-80 years, 30 pack-year currently smoking OR have quit w/in 15years. Patient does not qualify. Breast:  Up to date on Mammogram? Yes   Up to date of Bone Density/Dexa? Yes Colorectal: scheduled   Additional Screenings:: Hepatitis C Screening: complete      Plan:   Continue to exercise, increase water intake   I have personally reviewed and noted the following in the patient's chart:   . Medical and social history . Use of alcohol, tobacco or illicit drugs  . Current medications and supplements . Functional ability and status . Nutritional status . Physical activity . Advanced directives . List of other physicians . Hospitalizations, surgeries, and ER visits in previous 12 months . Vitals . Screenings to include cognitive, depression, and falls . Referrals and appointments  In addition, I have reviewed and discussed with patient certain preventive protocols,  quality metrics, and best practice recommendations. A written personalized care plan for preventive services as well as general preventive health recommendations were provided to patient.     Hayden Pedro, LPN  [id number]

## 2018-01-03 NOTE — Progress Notes (Signed)
seroquel

## 2018-01-03 NOTE — Patient Instructions (Signed)
Gina Black , Thank you for taking time to come for your Medicare Wellness Visit. I appreciate your ongoing commitment to your health goals. Please review the following plan we discussed and let me know if I can assist you in the future.   Screening recommendations/referrals: Colonoscopy: scheduled for December  Mammogram: scheduled for December  Bone Density: complete  Recommended yearly ophthalmology/optometry visit for glaucoma screening and checkup Recommended yearly dental visit for hygiene and checkup  Vaccinations: Influenza vaccine: Up to date  Pneumococcal vaccine: second shot given  Tdap vaccine: up to date  Shingles vaccine: postponed   Advanced directives: information given   Conditions/risks identified: hypertension  Next appointment: Wellness visit in one year   Preventive Care 40-64 Years, Female Preventive care refers to lifestyle choices and visits with your health care provider that can promote health and wellness. What does preventive care include?  A yearly physical exam. This is also called an annual well check.  Dental exams once or twice a year.  Routine eye exams. Ask your health care provider how often you should have your eyes checked.  Personal lifestyle choices, including:  Daily care of your teeth and gums.  Regular physical activity.  Eating a healthy diet.  Avoiding tobacco and drug use.  Limiting alcohol use.  Practicing safe sex.  Taking low-dose aspirin daily starting at age 45.  Taking vitamin and mineral supplements as recommended by your health care provider. What happens during an annual well check? The services and screenings done by your health care provider during your annual well check will depend on your age, overall health, lifestyle risk factors, and family history of disease. Counseling  Your health care provider may ask you questions about your:  Alcohol use.  Tobacco use.  Drug use.  Emotional  well-being.  Home and relationship well-being.  Sexual activity.  Eating habits.  Work and work Statistician.  Method of birth control.  Menstrual cycle.  Pregnancy history. Screening  You may have the following tests or measurements:  Height, weight, and BMI.  Blood pressure.  Lipid and cholesterol levels. These may be checked every 5 years, or more frequently if you are over 63 years old.  Skin check.  Lung cancer screening. You may have this screening every year starting at age 68 if you have a 30-pack-year history of smoking and currently smoke or have quit within the past 15 years.  Fecal occult blood test (FOBT) of the stool. You may have this test every year starting at age 62.  Flexible sigmoidoscopy or colonoscopy. You may have a sigmoidoscopy every 5 years or a colonoscopy every 10 years starting at age 4.  Hepatitis C blood test.  Hepatitis B blood test.  Sexually transmitted disease (STD) testing.  Diabetes screening. This is done by checking your blood sugar (glucose) after you have not eaten for a while (fasting). You may have this done every 1-3 years.  Mammogram. This may be done every 1-2 years. Talk to your health care provider about when you should start having regular mammograms. This may depend on whether you have a family history of breast cancer.  BRCA-related cancer screening. This may be done if you have a family history of breast, ovarian, tubal, or peritoneal cancers.  Pelvic exam and Pap test. This may be done every 3 years starting at age 13. Starting at age 29, this may be done every 5 years if you have a Pap test in combination with an HPV test.  Bone  density scan. This is done to screen for osteoporosis. You may have this scan if you are at high risk for osteoporosis. Discuss your test results, treatment options, and if necessary, the need for more tests with your health care provider. Vaccines  Your health care provider may recommend  certain vaccines, such as:  Influenza vaccine. This is recommended every year.  Tetanus, diphtheria, and acellular pertussis (Tdap, Td) vaccine. You may need a Td booster every 10 years.  Zoster vaccine. You may need this after age 33.  Pneumococcal 13-valent conjugate (PCV13) vaccine. You may need this if you have certain conditions and were not previously vaccinated.  Pneumococcal polysaccharide (PPSV23) vaccine. You may need one or two doses if you smoke cigarettes or if you have certain conditions. Talk to your health care provider about which screenings and vaccines you need and how often you need them. This information is not intended to replace advice given to you by your health care provider. Make sure you discuss any questions you have with your health care provider. Document Released: 02/21/2015 Document Revised: 10/15/2015 Document Reviewed: 11/26/2014 Elsevier Interactive Patient Education  2017 [employer] Prevention in the Home Falls can cause injuries. They can happen to people of all ages. There are many things you can do to make your home safe and to help prevent falls. What can I do on the outside of my home?  Regularly fix the edges of walkways and driveways and fix any cracks.  Remove anything that might make you trip as you walk through a door, such as a raised step or threshold.  Trim any bushes or trees on the path to your home.  Use bright outdoor lighting.  Clear any walking paths of anything that might make someone trip, such as rocks or tools.  Regularly check to see if handrails are loose or broken. Make sure that both sides of any steps have handrails.  Any raised decks and porches should have guardrails on the edges.  Have any leaves, snow, or ice cleared regularly.  Use sand or salt on walking paths during winter.  Clean up any spills in your garage right away. This includes oil or grease spills. What can I do in the bathroom?  Use  night lights.  Install grab bars by the toilet and in the tub and shower. Do not use towel bars as grab bars.  Use non-skid mats or decals in the tub or shower.  If you need to sit down in the shower, use a plastic, non-slip stool.  Keep the floor dry. Clean up any water that spills on the floor as soon as it happens.  Remove soap buildup in the tub or shower regularly.  Attach bath mats securely with double-sided non-slip rug tape.  Do not have throw rugs and other things on the floor that can make you trip. What can I do in the bedroom?  Use night lights.  Make sure that you have a light by your bed that is easy to reach.  Do not use any sheets or blankets that are too big for your bed. They should not hang down onto the floor.  Have a firm chair that has side arms. You can use this for support while you get dressed.  Do not have throw rugs and other things on the floor that can make you trip. What can I do in the kitchen?  Clean up any spills right away.  Avoid walking on wet  floors.  Keep items that you use a lot in easy-to-reach places.  If you need to reach something above you, use a strong step stool that has a grab bar.  Keep electrical cords out of the way.  Do not use floor polish or wax that makes floors slippery. If you must use wax, use non-skid floor wax.  Do not have throw rugs and other things on the floor that can make you trip. What can I do with my stairs?  Do not leave any items on the stairs.  Make sure that there are handrails on both sides of the stairs and use them. Fix handrails that are broken or loose. Make sure that handrails are as long as the stairways.  Check any carpeting to make sure that it is firmly attached to the stairs. Fix any carpet that is loose or worn.  Avoid having throw rugs at the top or bottom of the stairs. If you do have throw rugs, attach them to the floor with carpet tape.  Make sure that you have a light switch at  the top of the stairs and the bottom of the stairs. If you do not have them, ask someone to add them for you. What else can I do to help prevent falls?  Wear shoes that:  Do not have high heels.  Have rubber bottoms.  Are comfortable and fit you well.  Are closed at the toe. Do not wear sandals.  If you use a stepladder:  Make sure that it is fully opened. Do not climb a closed stepladder.  Make sure that both sides of the stepladder are locked into place.  Ask someone to hold it for you, if possible.  Clearly mark and make sure that you can see:  Any grab bars or handrails.  First and last steps.  Where the edge of each step is.  Use tools that help you move around (mobility aids) if they are needed. These include:  Canes.  Walkers.  Scooters.  Crutches.  Turn on the lights when you go into a dark area. Replace any light bulbs as soon as they burn out.  Set up your furniture so you have a clear path. Avoid moving your furniture around.  If any of your floors are uneven, fix them.  If there are any pets around you, be aware of where they are.  Review your medicines with your doctor. Some medicines can make you feel dizzy. This can increase your chance of falling. Ask your doctor what other things that you can do to help prevent falls. This information is not intended to replace advice given to you by your health care provider. Make sure you discuss any questions you have with your health care provider. Document Released: 11/21/2008 Document Revised: 07/03/2015 Document Reviewed: 03/01/2014 Elsevier Interactive Patient Education  2017 [employer].

## 2018-01-09 ENCOUNTER — Ambulatory Visit (HOSPITAL_COMMUNITY): Payer: Medicare Other

## 2018-01-12 ENCOUNTER — Ambulatory Visit (HOSPITAL_COMMUNITY)
Admission: RE | Admit: 2018-01-12 | Discharge: 2018-01-12 | Disposition: A | Discharge status: Home / Self Care | Payer: Medicare Other | Source: Ambulatory Visit | Attending: Family Medicine | Admitting: Family Medicine

## 2018-01-12 DIAGNOSIS — Z1231 Encounter for screening mammogram for malignant neoplasm of breast: Secondary | ICD-10-CM | POA: Diagnosis not present

## 2018-01-31 ENCOUNTER — Telehealth: Payer: Self-pay | Admitting: Gastroenterology

## 2018-01-31 NOTE — Progress Notes (Addendum)
pts daughter called today and said the prep was too expensive. Procedure is Monday, office will be closed for the next two days d/t the holidays. I have faxed a written rx with instructions to the pts pharmacy. [place], spoke with Arnette Norris,  and they are aware and will make sure the pt gets her instructions.  See phone note.

## 2018-01-31 NOTE — Telephone Encounter (Signed)
[phone] patient daughter called and said her mothers prep that was called in is expensive and would like something else less expensive.

## 2018-01-31 NOTE — Telephone Encounter (Signed)
Tried to call daughter- Mikle Bosworth- NA-LMOM that I faxed new rx to [facility] with instructions. I advised her that I spoke with Arnette Norris at [facility] and he would put the instructions with the prep. I also advised that she will need to purchase the enema and the dulcolax and that I had put those instructions with the fax that I sent. Informed her that the office will be closed for the next 2 days and will reopen on Friday if they have any questions.

## 2018-02-06 ENCOUNTER — Other Ambulatory Visit: Payer: Self-pay

## 2018-02-06 ENCOUNTER — Encounter (HOSPITAL_COMMUNITY)
Admission: RE | Disposition: A | Discharge status: Home / Self Care | Payer: Self-pay | Source: Home / Self Care | Attending: Gastroenterology

## 2018-02-06 ENCOUNTER — Ambulatory Visit (HOSPITAL_COMMUNITY)
Admission: RE | Admit: 2018-02-06 | Discharge: 2018-02-06 | Disposition: A | Discharge status: Home / Self Care | Payer: Medicare Other | Attending: Gastroenterology | Admitting: Gastroenterology

## 2018-02-06 ENCOUNTER — Encounter (HOSPITAL_COMMUNITY): Payer: Self-pay

## 2018-02-06 DIAGNOSIS — E785 Hyperlipidemia, unspecified: Secondary | ICD-10-CM | POA: Insufficient documentation

## 2018-02-06 DIAGNOSIS — Z1211 Encounter for screening for malignant neoplasm of colon: Secondary | ICD-10-CM

## 2018-02-06 DIAGNOSIS — K648 Other hemorrhoids: Secondary | ICD-10-CM | POA: Diagnosis not present

## 2018-02-06 DIAGNOSIS — Z79899 Other long term (current) drug therapy: Secondary | ICD-10-CM | POA: Insufficient documentation

## 2018-02-06 DIAGNOSIS — D122 Benign neoplasm of ascending colon: Secondary | ICD-10-CM | POA: Diagnosis not present

## 2018-02-06 DIAGNOSIS — Z87891 Personal history of nicotine dependence: Secondary | ICD-10-CM | POA: Insufficient documentation

## 2018-02-06 DIAGNOSIS — I10 Essential (primary) hypertension: Secondary | ICD-10-CM | POA: Diagnosis not present

## 2018-02-06 DIAGNOSIS — Q438 Other specified congenital malformations of intestine: Secondary | ICD-10-CM | POA: Insufficient documentation

## 2018-02-06 DIAGNOSIS — K644 Residual hemorrhoidal skin tags: Secondary | ICD-10-CM | POA: Diagnosis not present

## 2018-02-06 DIAGNOSIS — D126 Benign neoplasm of colon, unspecified: Secondary | ICD-10-CM

## 2018-02-06 HISTORY — PX: POLYPECTOMY: SHX5525

## 2018-02-06 HISTORY — PX: COLONOSCOPY: SHX5424

## 2018-02-06 SURGERY — COLONOSCOPY
Anesthesia: Moderate Sedation

## 2018-02-06 MED ORDER — MIDAZOLAM HCL 5 MG/5ML IJ SOLN
INTRAMUSCULAR | Status: AC
  Filled 2018-02-06: qty 10

## 2018-02-06 MED ORDER — MEPERIDINE HCL 100 MG/ML IJ SOLN
INTRAMUSCULAR | Status: AC
  Filled 2018-02-06: qty 2

## 2018-02-06 MED ORDER — MIDAZOLAM HCL 5 MG/5ML IJ SOLN
INTRAMUSCULAR | Status: DC | PRN
  Administered 2018-02-06 (×2): 2 mg via INTRAVENOUS

## 2018-02-06 MED ORDER — SODIUM CHLORIDE 0.9 % IV SOLN
INTRAVENOUS | Status: DC
  Administered 2018-02-06: 14:00:00 via INTRAVENOUS

## 2018-02-06 MED ORDER — MEPERIDINE HCL 100 MG/ML IJ SOLN
INTRAMUSCULAR | Status: DC | PRN
  Administered 2018-02-06 (×2): 25 mg

## 2018-02-06 NOTE — Discharge Instructions (Signed)
You had 1 polyp removed. You have SMALL internal hemorrhoids.   DRINK WATER TO KEEP YOUR URINE LIGHT YELLOW.  FOLLOW A HIGH FIBER DIET. AVOID ITEMS THAT CAUSE BLOATING & GAS. SEE INFO BELOW.  USE PREPARATION H FOUR TIMES  A DAY IF NEEDED TO RELIEVE RECTAL PAIN/PRESSURE/BLEEDING.   YOUR BIOPSY RESULTS WILL BE BACK IN 5 BUSINESS DAYS.  Next colonoscopy in 5-10 years.    Colonoscopy Care After Read the instructions outlined below and refer to this sheet in the next week. These discharge instructions provide you with general information on caring for yourself after you leave the hospital. While your treatment has been planned according to the most current medical practices available, unavoidable complications occasionally occur. If you have any problems or questions after discharge, call DR. Whitlee Black, [phone].  ACTIVITY  You may resume your regular activity, but move at a slower pace for the next 24 hours.   Take frequent rest periods for the next 24 hours.   Walking will help get rid of the air and reduce the bloated feeling in your belly (abdomen).   No driving for 24 hours (because of the medicine (anesthesia) used during the test).   You may shower.   Do not sign any important legal documents or operate any machinery for 24 hours (because of the anesthesia used during the test).    NUTRITION  Drink plenty of fluids.   You may resume your normal diet as instructed by your doctor.   Begin with a light meal and progress to your normal diet. Heavy or fried foods are harder to digest and may make you feel sick to your stomach (nauseated).   Avoid alcoholic beverages for 24 hours or as instructed.    MEDICATIONS  You may resume your normal medications.   WHAT YOU CAN EXPECT TODAY  Some feelings of bloating in the abdomen.   Passage of more gas than usual.   Spotting of blood in your stool or on the toilet paper  .  IF YOU HAD POLYPS REMOVED DURING THE  COLONOSCOPY:  Eat a soft diet IF YOU HAVE NAUSEA, BLOATING, ABDOMINAL PAIN, OR VOMITING.    FINDING OUT THE RESULTS OF YOUR TEST Not all test results are available during your visit. DR. Oneida Alar WILL CALL YOU WITHIN 14 DAYS OF YOUR PROCEDUE WITH YOUR RESULTS. Do not assume everything is normal if you have not heard from DR. Sabria Florido, CALL HER OFFICE AT [phone].  SEEK IMMEDIATE MEDICAL ATTENTION AND CALL THE OFFICE: [phone] IF:  You have more than a spotting of blood in your stool.   Your belly is swollen (abdominal distention).   You are nauseated or vomiting.   You have a temperature over 101F.   You have abdominal pain or discomfort that is severe or gets worse throughout the day.   High-Fiber Diet A high-fiber diet changes your normal diet to include more whole grains, legumes, fruits, and vegetables. Changes in the diet involve replacing refined carbohydrates with unrefined foods. The calorie level of the diet is essentially unchanged. The Dietary Reference Intake (recommended amount) for adult males is 38 grams per day. For adult females, it is 25 grams per day. Pregnant and lactating women should consume 28 grams of fiber per day. Fiber is the intact part of a plant that is not broken down during digestion. Functional fiber is fiber that has been isolated from the plant to provide a beneficial effect in the body.  PURPOSE  Increase stool bulk.  Ease and regulate bowel movements.   Lower cholesterol.   REDUCE RISK OF COLON CANCER  INDICATIONS THAT YOU NEED MORE FIBER  Constipation and hemorrhoids.   Uncomplicated diverticulosis (intestine condition) and irritable bowel syndrome.   Weight management.   As a protective measure against hardening of the arteries (atherosclerosis), diabetes, and cancer.   GUIDELINES FOR INCREASING FIBER IN THE DIET  Start adding fiber to the diet slowly. A gradual increase of about 5 more grams (2 slices of whole-wheat bread, 2  servings of most fruits or vegetables, or 1 bowl of high-fiber cereal) per day is best. Too rapid an increase in fiber may result in constipation, flatulence, and bloating.   Drink enough water and fluids to keep your urine clear or pale yellow. Water, juice, or caffeine-free drinks are recommended. Not drinking enough fluid may cause constipation.   Eat a variety of high-fiber foods rather than one type of fiber.   Try to increase your intake of fiber through using high-fiber foods rather than fiber pills or supplements that contain small amounts of fiber.   The goal is to change the types of food eaten. Do not supplement your present diet with high-fiber foods, but replace foods in your present diet.   INCLUDE A VARIETY OF FIBER SOURCES  Replace refined and processed grains with whole grains, canned fruits with fresh fruits, and incorporate other fiber sources. White rice, white breads, and most bakery goods contain little or no fiber.   Brown whole-grain rice, buckwheat oats, and many fruits and vegetables are all good sources of fiber. These include: broccoli, Brussels sprouts, cabbage, cauliflower, beets, sweet potatoes, white potatoes (skin on), carrots, tomatoes, eggplant, squash, berries, fresh fruits, and dried fruits.   Cereals appear to be the richest source of fiber. Cereal fiber is found in whole grains and bran. Bran is the fiber-rich outer coat of cereal grain, which is largely removed in refining. In whole-grain cereals, the bran remains. In breakfast cereals, the largest amount of fiber is found in those with "bran" in their names. The fiber content is sometimes indicated on the label.   You may need to include additional fruits and vegetables each day.   In baking, for 1 cup white flour, you may use the following substitutions:   1 cup whole-wheat flour minus 2 tablespoons.   1/2 cup white flour plus 1/2 cup whole-wheat flour.   Polyps, Colon  A polyp is extra tissue that  grows inside your body. Colon polyps grow in the large intestine. The large intestine, also called the colon, is part of your digestive system. It is a long, hollow tube at the end of your digestive tract where your body makes and stores stool. Most polyps are not dangerous. They are benign. This means they are not cancerous. But over time, some types of polyps can turn into cancer. Polyps that are smaller than a pea are usually not harmful. But larger polyps could someday become or may already be cancerous. To be safe, doctors remove all polyps and test them.   WHO GETS POLYPS? Anyone can get polyps, but certain people are more likely than others. You may have a greater chance of getting polyps if:  You are over 50.   You have had polyps before.   Someone in your family has had polyps.   Someone in your family has had cancer of the large intestine.   Find out if someone in your family has had polyps. You may also be  more likely to get polyps if you:   Eat a lot of fatty foods   Smoke   Drink alcohol   Do not exercise  Eat too much   PREVENTION There is not one sure way to prevent polyps. You might be able to lower your risk of getting them if you:  Eat more fruits and vegetables and less fatty food.   Do not smoke.   Avoid alcohol.   Exercise every day.   Lose weight if you are overweight.   Eating more calcium and folate can also lower your risk of getting polyps. Some foods that are rich in calcium are milk, cheese, and broccoli. Some foods that are rich in folate are chickpeas, kidney beans, and spinach.

## 2018-02-06 NOTE — Op Note (Signed)
[facility] Patient Name: Gina Black Procedure Date: 02/06/2018 2:30 PM MRN: [id number] Date of Birth: [birth year] Attending MD: Barney Drain MD, MD CSN: [id number] Age: 62 Admit Type: Outpatient Procedure:                Colonoscopy WITH COLD SNARE POLYPECTOMY Indications:              Screening for colorectal malignant neoplasm Providers:                Barney Drain MD, MD, Gerome Sam, RN, Aram Candela Referring MD:             Norwood Levo. Simpson MD, MD Medicines:                Meperidine 50 mg IV, Midazolam 5 mg IV Complications:            No immediate complications. Estimated Blood Loss:     Estimated blood loss was minimal. Procedure:                Pre-Anesthesia Assessment:                           - Prior to the procedure, a History and Physical                            was performed, and patient medications and                            allergies were reviewed. The patient's tolerance of                            previous anesthesia was also reviewed. The risks                            and benefits of the procedure and the sedation                            options and risks were discussed with the patient.                            All questions were answered, and informed consent                            was obtained. Prior Anticoagulants: The patient has                            taken no previous anticoagulant or antiplatelet                            agents. ASA Grade Assessment: II - A patient with                            mild systemic disease. After reviewing the risks  and benefits, the patient was deemed in                            satisfactory condition to undergo the procedure.                            After obtaining informed consent, the colonoscope                            was passed under direct vision. Throughout the                            procedure, the  patient's blood pressure, pulse, and                            oxygen saturations were monitored continuously. The                            [id number]-[id number] ([id number]) scope was introduced through                            the anus and advanced to the the cecum, identified                            by appendiceal orifice and ileocecal valve. The                            colonoscopy was somewhat difficult due to a                            tortuous colon. Successful completion of the                            procedure was aided by straightening and shortening                            the scope to obtain bowel loop reduction and                            COLOWRAP. The patient tolerated the procedure well.                            The quality of the bowel preparation was good. The                            ileocecal valve, appendiceal orifice, and rectum                            were photographed. Scope In: 3:10:04 PM Scope Out: 3:26:07 PM Scope Withdrawal Time: 0 hours 12 minutes 0 seconds  Total Procedure Duration: 0 hours 16 minutes 3 seconds  Findings:      A 6 mm polyp was found in the ascending colon. The polyp was sessile.  The polyp was removed with a cold snare. Resection and retrieval were       complete.      The recto-sigmoid colon, sigmoid colon and descending colon were       moderately redundant.      External and internal hemorrhoids were found. Impression:               - One 6 mm polyp in the ascending colon, removed                            with a cold snare. Resected and retrieved.                           - Redundant colon.                           - External and internal hemorrhoids. Moderate Sedation:      Moderate (conscious) sedation was administered by the endoscopy nurse       and supervised by the endoscopist. The following parameters were       monitored: oxygen saturation, heart rate, blood pressure, and response       to care. Total  physician intraservice time was 28 minutes. Recommendation:           - Patient has a contact number available for                            emergencies. The signs and symptoms of potential                            delayed complications were discussed with the                            patient. Return to normal activities tomorrow.                            Written discharge instructions were provided to the                            patient.                           - High fiber diet.                           - Continue present medications.                           - Await pathology results.                           - Repeat colonoscopy in 5-10 years for surveillance. Procedure Code(s):        --- Professional ---                           [id number], Colonoscopy, flexible; with removal of  tumor(s), polyp(s), or other lesion(s) by snare                            technique                           99153, Moderate sedation; each additional 15                            minutes intraservice time                           G0500, Moderate sedation services provided by the                            same physician or other qualified health care                            professional performing a gastrointestinal                            endoscopic service that sedation supports,                            requiring the presence of an independent trained                            observer to assist in the monitoring of the                            patient's level of consciousness and physiological                            status; initial 15 minutes of intra-service time;                            patient age 80 years or older (additional time may                            be reported with [phone], as appropriate) Diagnosis Code(s):        --- Professional ---                           Z12.11, Encounter for screening for malignant                             neoplasm of colon                           D12.2, Benign neoplasm of ascending colon                           K64.8, Other hemorrhoids                           Q43.8, Other specified congenital malformations of  intestine CPT copyright 2018 [employer]. All rights reserved. The codes documented in this report are preliminary and upon coder review may  be revised to meet current compliance requirements. Barney Drain, MD Barney Drain MD, MD 02/06/2018 3:39:48 PM This report has been signed electronically. Number of Addenda: 0

## 2018-02-06 NOTE — H&P (Signed)
Primary Care Physician:  Fayrene Helper, MD Primary Gastroenterologist:  Dr. Oneida Alar  Pre-Procedure History & Physical: HPI:  Gina Black is a 62 y.o. female here for [place].  Past Medical History:  Diagnosis Date  . Hemorrhoids   . Hyperlipidemia   . Hypertension   . Psychotic disorder [facility]) August 2009   Hopital[place] Involuntary commited by daughter   . S/P colonoscopy 03/20/2008   Dr Oneida Alar normal, except hemorrhoids    Past Surgical History:  Procedure Laterality Date  . CHOLECYSTECTOMY  2001  . [place]   right     Prior to Admission medications   Medication Sig Start Date End Date Taking? Authorizing Provider  aspirin EC 81 MG tablet Take 81 mg by mouth daily as needed (pain).   Yes [provider]  Calcium Carbonate-Vitamin D (CALCIUM 600+D PO) Take 1 tablet by mouth daily.   Yes [provider]  ciclopirox (PENLAC) 8 % solution Apply topically at bedtime. Apply over nail and surrounding skin. Apply daily over previous coat. After seven (7) days, may remove with alcohol and continue cycle.   Yes [provider]  Na Sulfate-K Sulfate-Mg Sulf (SUPREP BOWEL PREP KIT) 17.5-3.13-1.6 GM/177ML SOLN Take 1 kit by mouth as directed. 12/07/17  Yes Annitta Needs, NP  triamterene-hydrochlorothiazide (MAXZIDE-25) 37.5-25 MG tablet Take 1 tablet by mouth daily. 04/04/17  Yes Fayrene Helper, MD  QUEtiapine (SEROQUEL) 25 MG tablet Take 1 tablet (25 mg total) by mouth at bedtime. Patient not taking: Reported on 01/03/2018 01/03/18   Fayrene Helper, MD    Allergies as of 12/07/2017 - Review Complete 12/07/2017  Allergen Reaction Noted  . Ciprofloxacin Swelling 09/05/2007  . Penicillins Other (See Comments) 08/06/2014  . Prednisone  09/05/2007    Family History  Problem Relation Age of Onset  . Hypertension Mother   . Diabetes Mother   . Kidney disease Mother   . Diabetes Father   . Colon cancer  Neg Hx   . Colon polyps Neg Hx     Social History   Socioeconomic History  . Marital status: Widowed    Spouse name: Not on file  . Number of children: 1  . Years of education: 73  . Highest education level: 12th grade  Occupational History  . Occupation: disabled    Comment: previously Civil engineer, contracting  . Financial resource strain: Not hard at all  . Food insecurity:    Worry: Never true    Inability: Never true  . Transportation needs:    Medical: No    Non-medical: No  Tobacco Use  . Smoking status: Former Smoker    Packs/day: 1.00    Years: 6.00    Pack years: 6.00    Types: Cigarettes    Last attempt to quit: 04/28/2010    Years since quitting: 7.7  . Smokeless tobacco: Never Used  Substance and Sexual Activity  . Alcohol use: No  . Drug use: No  . Sexual activity: Not Currently  Lifestyle  . Physical activity:    Days per week: 4 days    Minutes per session: 30 min  . Stress: Not at all  Relationships  . Social connections:    Talks on phone: More than three times a week    Gets together: More than three times a week    Attends religious service: Never    Active member of club or organization: No    Attends meetings  of clubs or organizations: Never    Relationship status: Widowed  . Intimate partner violence:    Fear of current or ex partner: No    Emotionally abused: No    Physically abused: No    Forced sexual activity: No  Other Topics Concern  . Not on file  Social History Narrative   Lives alone.  Daughter in [place].    Review of Systems: See HPI, otherwise negative ROS   Physical Exam: BP (!) 149/80   Pulse 74   Temp 97.8 F (36.6 C) (Oral)   Resp 19   Ht 5' 7"  (1.702 m)   Wt 72.6 kg   SpO2 100%   BMI 25.06 kg/m  General:   Alert,  pleasant and cooperative in NAD Head:  Normocephalic and atraumatic. Neck:  Supple; Lungs:  Clear throughout to auscultation.    Heart:  Regular rate and rhythm. Abdomen:  Soft, nontender  and nondistended. Normal bowel sounds, without guarding, and without rebound.   Neurologic:  Alert and  oriented x4;  grossly normal neurologically.  Impression/Plan:    SCREENING  Plan:  1. TCS TODAY DISCUSSED PROCEDURE, BENEFITS, & RISKS: < 1% chance of medication reaction, bleeding, perforation, or rupture of spleen/liver.

## 2018-02-13 ENCOUNTER — Encounter (HOSPITAL_COMMUNITY): Payer: Self-pay | Admitting: Gastroenterology

## 2018-02-17 ENCOUNTER — Encounter: Payer: Self-pay | Admitting: *Deleted

## 2018-02-17 NOTE — Progress Notes (Signed)
LMOM to call.

## 2018-02-20 NOTE — Progress Notes (Signed)
LMOM to call.

## 2018-02-21 NOTE — Progress Notes (Signed)
Letter mailed to call.  

## 2018-02-22 ENCOUNTER — Telehealth: Payer: Self-pay | Admitting: Gastroenterology

## 2018-02-22 NOTE — Telephone Encounter (Signed)
Gina Black was informed of the results of path and plan.

## 2018-02-22 NOTE — Telephone Encounter (Signed)
[phone]  Please call patient daughter, geraldine, she saw in patient mychart that the office was trying to reach the patient about results

## 2018-03-02 ENCOUNTER — Ambulatory Visit: Payer: Medicare Other | Admitting: Family Medicine

## 2018-03-06 DIAGNOSIS — I739 Peripheral vascular disease, unspecified: Secondary | ICD-10-CM | POA: Diagnosis not present

## 2018-03-06 DIAGNOSIS — M79675 Pain in left toe(s): Secondary | ICD-10-CM | POA: Diagnosis not present

## 2018-03-06 DIAGNOSIS — M79674 Pain in right toe(s): Secondary | ICD-10-CM | POA: Diagnosis not present

## 2018-03-06 DIAGNOSIS — B351 Tinea unguium: Secondary | ICD-10-CM | POA: Diagnosis not present

## 2018-03-14 ENCOUNTER — Encounter: Payer: Self-pay | Admitting: Family Medicine

## 2018-03-14 ENCOUNTER — Ambulatory Visit (INDEPENDENT_AMBULATORY_CARE_PROVIDER_SITE_OTHER): Payer: Medicare Other | Admitting: Family Medicine

## 2018-03-14 VITALS — BP 130/82 | HR 78 | Resp 15 | Ht 67.0 in | Wt 179.0 lb

## 2018-03-14 DIAGNOSIS — E785 Hyperlipidemia, unspecified: Secondary | ICD-10-CM

## 2018-03-14 DIAGNOSIS — D126 Benign neoplasm of colon, unspecified: Secondary | ICD-10-CM | POA: Diagnosis not present

## 2018-03-14 DIAGNOSIS — I10 Essential (primary) hypertension: Secondary | ICD-10-CM | POA: Diagnosis not present

## 2018-03-14 DIAGNOSIS — F29 Unspecified psychosis not due to a substance or known physiological condition: Secondary | ICD-10-CM | POA: Diagnosis not present

## 2018-03-14 DIAGNOSIS — E663 Overweight: Secondary | ICD-10-CM | POA: Diagnosis not present

## 2018-03-14 NOTE — Patient Instructions (Addendum)
F/U end August, call if you need me before  You may stop aspirin , it is no longer recommended for you with your current health condition  Please increase fruit and vegetable , and cut back on fried and fatty foods and sugary foods  Please start daily exercise even int he home you can walk in place for 30 minutes   Fasting lipid, cmp and EGFR this week, your cholesterol was high when last checked, hope it is better

## 2018-03-18 ENCOUNTER — Other Ambulatory Visit: Payer: Self-pay | Admitting: Family Medicine

## 2018-03-18 DIAGNOSIS — E785 Hyperlipidemia, unspecified: Secondary | ICD-10-CM | POA: Diagnosis not present

## 2018-03-18 DIAGNOSIS — I10 Essential (primary) hypertension: Secondary | ICD-10-CM | POA: Diagnosis not present

## 2018-03-18 DIAGNOSIS — E663 Overweight: Secondary | ICD-10-CM | POA: Insufficient documentation

## 2018-03-18 NOTE — Assessment & Plan Note (Signed)
Hyperlipidemia:Low fat diet discussed and encouraged.   Lipid Panel  Lab Results  Component Value Date   CHOL 224 (H) 10/15/2017   HDL 67 10/15/2017   LDLCALC 148 (H) 10/15/2017   TRIG 44 10/15/2017   CHOLHDL 3.3 10/15/2017   Updated lab needed , uncontrolled ,needs to modify diet

## 2018-03-18 NOTE — Assessment & Plan Note (Signed)
Reports stability, despite the fact that she is on no medication , self imposed , at this time. Since her daughter has not reached out for this visit , with any specific message of concern, no changes indicated currently

## 2018-03-18 NOTE — Assessment & Plan Note (Signed)
Deteriorated.  Patient re-educated about  the importance of commitment to a  minimum of 150 minutes of exercise per week as able.  The importance of healthy food choices with portion control discussed, as well as eating regularly and within a 12 hour window most days. The need to choose "clean , green" food 50 to 75% of the time is discussed, as well as to make water the primary drink and set a goal of 64 ounces water daily.  Encouraged to start a food diary,  and to consider  joining a support group. Sample diet sheets offered. Goals set by the patient for the next several months.   Weight /BMI 03/14/2018 02/06/2018 01/03/2018  WEIGHT 179 lb 160 lb 169 lb  HEIGHT 5\' 7"  5\' 7"  5\' 7"   BMI 28.04 kg/m2 25.06 kg/m2 26.47 kg/m2

## 2018-03-18 NOTE — Assessment & Plan Note (Signed)
Controlled, no change in medication DASH diet and commitment to daily physical activity for a minimum of 30 minutes discussed and encouraged, as a part of hypertension management. The importance of attaining a healthy weight is also discussed.  BP/Weight 03/14/2018 02/06/2018 01/03/2018 10/13/2017 04/04/2017 12/20/2016 7/54/3606  Systolic BP 770 340 352 481 859 093 112  Diastolic BP 82 69 81 82 84 86 80  Wt. (Lbs) 179 160 169 166.12 168.12 179 170  BMI 28.04 25.06 26.47 26.02 26.33 28.04 26.63

## 2018-03-18 NOTE — Progress Notes (Signed)
Gina Black     MRN: [id number]      DOB: 01-03-1956   HPI Ms. Crago is here for follow up and re-evaluation of chronic medical conditions, medication management and review of any available recent lab and radiology data.  Preventive health is updated, specifically  Cancer screening and Immunization.   Questions or concerns regarding consultations or procedures which the PT has had in the interim are  addressed. The PT denies any adverse reactions to current medications since the last visit. She has independently decided to discontinue her antipsychotic despite the fact that she denies adverse s/e There are no new concerns.  There are no specific complaints Daughter has sent no recent messages of concern  ROS Denies recent fever or chills. Denies sinus pressure, nasal congestion, ear pain or sore throat. Denies chest congestion, productive cough or wheezing. Denies chest pains, palpitations and leg swelling Denies abdominal pain, nausea, vomiting,diarrhea or constipation.   Denies dysuria, frequency, hesitancy or incontinence. Denies joint pain, swelling and limitation in mobility. Denies headaches, seizures, numbness, or tingling. Denies depression, anxiety or insomnia.Denies hallucinations Denies skin break down or rash.   PE  BP 130/82   Pulse 78   Resp 15   Ht 5\' 7"  (1.702 m)   Wt 179 lb (81.2 kg)   SpO2 97%   BMI 28.04 kg/m   Patient alert and oriented and in no cardiopulmonary distress.  HEENT: No facial asymmetry, EOMI,   oropharynx pink and moist.  Neck supple no JVD, no mass.  Chest: Clear to auscultation bilaterally.  CVS: S1, S2 no murmurs, no S3.Regular rate.  ABD: Soft non tender.   Ext: No edema  MS: Adequate ROM spine, shoulders, hips and knees.  Skin: Intact, no ulcerations or rash noted.  Psych: Good eye contact, normal affect. Memory intact not anxious or depressed appearing.  CNS: CN 2-12 intact, power,  normal throughout.no focal  deficits noted.   Assessment & Plan  Essential hypertension Controlled, no change in medication DASH diet and commitment to daily physical activity for a minimum of 30 minutes discussed and encouraged, as a part of hypertension management. The importance of attaining a healthy weight is also discussed.  BP/Weight 03/14/2018 02/06/2018 01/03/2018 10/13/2017 04/04/2017 12/20/2016 08/04/348  Systolic BP 093 818 299 371 696 789 381  Diastolic BP 82 69 81 82 84 86 80  Wt. (Lbs) 179 160 169 166.12 168.12 179 170  BMI 28.04 25.06 26.47 26.02 26.33 28.04 26.63       Dyslipidemia Hyperlipidemia:Low fat diet discussed and encouraged.   Lipid Panel  Lab Results  Component Value Date   CHOL 224 (H) 10/15/2017   HDL 67 10/15/2017   LDLCALC 148 (H) 10/15/2017   TRIG 44 10/15/2017   CHOLHDL 3.3 10/15/2017   Updated lab needed , uncontrolled ,needs to modify diet     Psychotic disorder with delusions Reports stability, despite the fact that she is on no medication , self imposed , at this time. Since her daughter has not reached out for this visit , with any specific message of concern, no changes indicated currently  Overweight (BMI 25.0-29.9) Deteriorated.  Patient re-educated about  the importance of commitment to a  minimum of 150 minutes of exercise per week as able.  The importance of healthy food choices with portion control discussed, as well as eating regularly and within a 12 hour window most days. The need to choose "clean , green" food 50 to 75% of the time is  discussed, as well as to make water the primary drink and set a goal of 64 ounces water daily.  Encouraged to start a food diary,  and to consider  joining a support group. Sample diet sheets offered. Goals set by the patient for the next several months.   Weight /BMI 03/14/2018 02/06/2018 01/03/2018  WEIGHT 179 lb 160 lb 169 lb  HEIGHT 5\' 7"  5\' 7"  5\' 7"   BMI 28.04 kg/m2 25.06 kg/m2 26.47 kg/m2

## 2018-03-19 ENCOUNTER — Encounter: Payer: Self-pay | Admitting: Family Medicine

## 2018-03-19 LAB — LIPID PANEL W/O CHOL/HDL RATIO
Cholesterol, Total: 203 mg/dL — ABNORMAL HIGH (ref 100–199)
HDL: 70 mg/dL (ref >39)
LDL Calculated: 123 mg/dL — ABNORMAL HIGH (ref 0–99)
Triglycerides: 49 mg/dL (ref 0–149)
VLDL Cholesterol Cal: 10 mg/dL (ref 5–40)

## 2018-03-19 LAB — COMPREHENSIVE METABOLIC PANEL
ALT: 25 IU/L (ref 0–32)
AST: 27 IU/L (ref 0–40)
Albumin/Globulin Ratio: 1.3 (ref 1.2–2.2)
Albumin: 4.2 g/dL (ref 3.8–4.8)
Alkaline Phosphatase: 60 IU/L (ref 39–117)
BUN/Creatinine Ratio: 18 (ref 12–28)
BUN: 19 mg/dL (ref 8–27)
Bilirubin Total: 0.8 mg/dL (ref 0.0–1.2)
CO2: 26 mmol/L (ref 20–29)
Calcium: 9.4 mg/dL (ref 8.7–10.3)
Chloride: 102 mmol/L (ref 96–106)
Creatinine, Ser: 1.03 mg/dL — ABNORMAL HIGH (ref 0.57–1.00)
GFR calc non Af Amer: 58 mL/min/{1.73_m2} — ABNORMAL LOW (ref >59)
GFR, EST AFRICAN AMERICAN: 67 mL/min/{1.73_m2} (ref >59)
Globulin, Total: 3.2 g/dL (ref 1.5–4.5)
Glucose: 86 mg/dL (ref 65–99)
Potassium: 3.5 mmol/L (ref 3.5–5.2)
Sodium: 144 mmol/L (ref 134–144)
TOTAL PROTEIN: 7.4 g/dL (ref 6.0–8.5)

## 2018-03-19 LAB — SPECIMEN STATUS REPORT

## 2018-04-25 ENCOUNTER — Other Ambulatory Visit: Payer: Self-pay | Admitting: Family Medicine

## 2018-05-15 DIAGNOSIS — I739 Peripheral vascular disease, unspecified: Secondary | ICD-10-CM | POA: Diagnosis not present

## 2018-05-15 DIAGNOSIS — B351 Tinea unguium: Secondary | ICD-10-CM | POA: Diagnosis not present

## 2018-05-15 DIAGNOSIS — M79675 Pain in left toe(s): Secondary | ICD-10-CM | POA: Diagnosis not present

## 2018-05-15 DIAGNOSIS — M79674 Pain in right toe(s): Secondary | ICD-10-CM | POA: Diagnosis not present

## 2018-05-30 ENCOUNTER — Other Ambulatory Visit: Payer: Self-pay | Admitting: Family Medicine

## 2018-06-15 ENCOUNTER — Encounter: Payer: Self-pay | Admitting: *Deleted

## 2018-08-10 ENCOUNTER — Other Ambulatory Visit: Payer: Self-pay | Admitting: Family Medicine

## 2018-08-21 DIAGNOSIS — M79674 Pain in right toe(s): Secondary | ICD-10-CM | POA: Diagnosis not present

## 2018-08-21 DIAGNOSIS — B351 Tinea unguium: Secondary | ICD-10-CM | POA: Diagnosis not present

## 2018-08-21 DIAGNOSIS — M79675 Pain in left toe(s): Secondary | ICD-10-CM | POA: Diagnosis not present

## 2018-08-21 DIAGNOSIS — I739 Peripheral vascular disease, unspecified: Secondary | ICD-10-CM | POA: Diagnosis not present

## 2018-09-14 ENCOUNTER — Other Ambulatory Visit: Payer: Self-pay | Admitting: Family Medicine

## 2018-10-02 ENCOUNTER — Ambulatory Visit: Payer: Medicare Other | Admitting: Family Medicine

## 2018-10-04 ENCOUNTER — Telehealth: Payer: Self-pay

## 2018-10-04 NOTE — Telephone Encounter (Signed)
I spoke with Gina Black and she was very appreciative of the phone call. She has noticed changes in her mom not remembering well and at times getting mixed up with things. She lives out of the area but has care givers looking after her and she will be in touch with them on a more regular basis.

## 2018-10-04 NOTE — Telephone Encounter (Signed)
Pt said she reported to Dr. Oneida Alar when she had her colonoscopy in Dec last year that she weighed 168 lbs and she said she told her wrong, she weighs 148 lbs

## 2018-10-04 NOTE — Telephone Encounter (Signed)
Pt's daughter returned call. Please call her at [phone].

## 2018-10-04 NOTE — Telephone Encounter (Signed)
Called daughter, Mercy Moore @ [phone] to discuss what her mom was talking about.  Left VM for a return call.

## 2018-10-05 NOTE — Telephone Encounter (Signed)
REVIEWED-NO ADDITIONAL RECOMMENDATIONS. 

## 2018-10-09 ENCOUNTER — Other Ambulatory Visit: Payer: Self-pay | Admitting: Family Medicine

## 2018-10-26 ENCOUNTER — Encounter: Payer: Self-pay | Admitting: Family Medicine

## 2018-10-26 ENCOUNTER — Other Ambulatory Visit: Payer: Self-pay

## 2018-10-26 ENCOUNTER — Ambulatory Visit (INDEPENDENT_AMBULATORY_CARE_PROVIDER_SITE_OTHER): Payer: Medicare Other | Admitting: Family Medicine

## 2018-10-26 VITALS — BP 140/82 | HR 74 | Temp 98.1°F | Resp 15 | Ht 67.0 in | Wt 187.0 lb

## 2018-10-26 DIAGNOSIS — Z1231 Encounter for screening mammogram for malignant neoplasm of breast: Secondary | ICD-10-CM | POA: Diagnosis not present

## 2018-10-26 DIAGNOSIS — E785 Hyperlipidemia, unspecified: Secondary | ICD-10-CM

## 2018-10-26 DIAGNOSIS — F29 Unspecified psychosis not due to a substance or known physiological condition: Secondary | ICD-10-CM

## 2018-10-26 DIAGNOSIS — I10 Essential (primary) hypertension: Secondary | ICD-10-CM

## 2018-10-26 DIAGNOSIS — E663 Overweight: Secondary | ICD-10-CM | POA: Diagnosis not present

## 2018-10-26 DIAGNOSIS — D126 Benign neoplasm of colon, unspecified: Secondary | ICD-10-CM

## 2018-10-26 DIAGNOSIS — Z23 Encounter for immunization: Secondary | ICD-10-CM

## 2018-10-26 NOTE — Patient Instructions (Signed)
F/U in 6 months, in office with MD, call if you need me sooner  Flu vaccine today    Please watch foods, no more weight gain, this will push up blood pressure  We will schedule mammogram for December and let you know date and time  Thanks for choosing [facility], we consider it a privelige to serve you.

## 2018-10-29 ENCOUNTER — Other Ambulatory Visit: Payer: Self-pay | Admitting: Family Medicine

## 2018-10-29 MED ORDER — TRIAMTERENE-HCTZ 37.5-25 MG PO TABS: 1.0000 | ORAL_TABLET | Freq: Every day | ORAL | 5 refills | Status: DC

## 2018-10-29 NOTE — Assessment & Plan Note (Signed)
  Patient re-educated about  the importance of commitment to a  minimum of 150 minutes of exercise per week as able.  The importance of healthy food choices with portion control discussed, as well as eating regularly and within a 12 hour window most days. The need to choose "clean , green" food 50 to 75% of the time is discussed, as well as to make water the primary drink and set a goal of 64 ounces water daily.    Weight /BMI 10/26/2018 03/14/2018 02/06/2018  WEIGHT 187 lb 179 lb 160 lb  HEIGHT 5\' 7"  5\' 7"  5\' 7"   BMI 29.29 kg/m2 28.04 kg/m2 25.06 kg/m2  27 pound weight gain in pqwt 9 monhts, needs to increase vegetables and reduce sugary and processed foods

## 2018-10-29 NOTE — Assessment & Plan Note (Signed)
denies change in BM, or abdominal pain, next colonoscopy due in 2024

## 2018-10-29 NOTE — Assessment & Plan Note (Signed)
Not at goal, unclear / uncertain of compliance , a constant challenge, will resubmit medications for an additional 6 months and notify daughtr

## 2018-10-29 NOTE — Assessment & Plan Note (Signed)
Non compliant with prescription medication, however no potentially harmful behavior reported at this time

## 2018-10-29 NOTE — Assessment & Plan Note (Signed)
After obtaining informed consent, the vaccine is  administered , with no adverse effect noted at the time of administration.  

## 2018-10-29 NOTE — Progress Notes (Signed)
Gina Black     MRN: [id number]      DOB: April 13, 1955   HPI Ms. Coate is here for follow up and re-evaluation of chronic medical conditions, medication management and review of any available recent lab and radiology data.  Preventive health is updated, specifically  Cancer screening and Immunization.   .  There are no new concerns.  There are no specific complaints . She is unaccompanied, an unreliable historian because of her  Health, however, her daughter, who is very attentive,  has not sent any concerns in advance of the visit. Unclear as to whether  Pt is consistently taking her blood pressure medication, appears unlikely based on prescriptuion refill review, will f/u on this. Increased weight gain, primarily home confined  ROS Reports no concerns Denies recent fever or chills. Denies sinus pressure, nasal congestion, ear pain or sore throat. Denies chest congestion, productive cough or wheezing. Denies chest pains, palpitations and leg swelling Denies abdominal pain,, vomiting,diarrhea or constipation.   Denies dysuria, frequency, . Denies joint pain, swelling and limitation in mobility. Denies headaches, seizures, numbness, or tingling. Denies depression, anxiety or insomnia. Denies skin break down or rash.   PE  BP 140/82   Pulse 74   Temp 98.1 F (36.7 C) (Temporal)   Resp 15   Ht 5\' 7"  (1.702 m)   Wt 187 lb (84.8 kg)   SpO2 97%   BMI 29.29 kg/m   Patient alert  and in no cardiopulmonary distress.  HEENT: No facial asymmetry, EOMI,   or  Neck supple no JVD, no mass.  Chest: Clear to auscultation bilaterally.  CVS: S1, S2 no murmurs, no S3.Regular rate.  ABD: Soft non tender.   Ext: No edema  MS: Adequate ROM spine, shoulders, hips and knees.  Skin: Intact, no ulcerations or rash noted.  Psych: Good eye contact,  not anxious or depressed appearing.  CNS: CN 2-12 intact, power,  normal throughout.no focal deficits noted.   Assessment &  Plan  Essential hypertension Not at goal, unclear / uncertain of compliance , a constant challenge, will resubmit medications for an additional 6 months and notify daughtr  Adenomatous polyp of colon denies change in BM, or abdominal pain, next colonoscopy due in 2024  Dyslipidemia Hyperlipidemia:Low fat diet discussed and encouraged.   Lipid Panel  Lab Results  Component Value Date   CHOL 203 (H) 03/18/2018   HDL 70 03/18/2018   LDLCALC 123 (H) 03/18/2018   TRIG 49 03/18/2018   CHOLHDL 3.3 10/15/2017  updated lab in end Feb 2021    Overweight (BMI 25.0-29.9)  Patient re-educated about  the importance of commitment to a  minimum of 150 minutes of exercise per week as able.  The importance of healthy food choices with portion control discussed, as well as eating regularly and within a 12 hour window most days. The need to choose "clean , green" food 50 to 75% of the time is discussed, as well as to make water the primary drink and set a goal of 64 ounces water daily.    Weight /BMI 10/26/2018 03/14/2018 02/06/2018  WEIGHT 187 lb 179 lb 160 lb  HEIGHT 5\' 7"  5\' 7"  5\' 7"   BMI 29.29 kg/m2 28.04 kg/m2 25.06 kg/m2  27 pound weight gain in pqwt 9 monhts, needs to increase vegetables and reduce sugary and processed foods    Need for immunization against influenza After obtaining informed consent, the vaccine is  administered , with no adverse effect noted at  the time of administration.   Psychotic disorder with delusions Non compliant with prescription medication, however no potentially harmful behavior reported at this time

## 2018-10-29 NOTE — Assessment & Plan Note (Signed)
Hyperlipidemia:Low fat diet discussed and encouraged.   Lipid Panel  Lab Results  Component Value Date   CHOL 203 (H) 03/18/2018   HDL 70 03/18/2018   LDLCALC 123 (H) 03/18/2018   TRIG 49 03/18/2018   CHOLHDL 3.3 10/15/2017  updated lab in end Feb 2021

## 2018-11-09 ENCOUNTER — Telehealth: Payer: Self-pay

## 2018-11-09 DIAGNOSIS — E559 Vitamin D deficiency, unspecified: Secondary | ICD-10-CM

## 2018-11-09 DIAGNOSIS — E785 Hyperlipidemia, unspecified: Secondary | ICD-10-CM

## 2018-11-09 DIAGNOSIS — I10 Essential (primary) hypertension: Secondary | ICD-10-CM

## 2018-11-09 NOTE — Telephone Encounter (Signed)
Labs ordered per MD 

## 2018-11-22 ENCOUNTER — Other Ambulatory Visit: Payer: Self-pay | Admitting: Family Medicine

## 2018-12-04 DIAGNOSIS — I739 Peripheral vascular disease, unspecified: Secondary | ICD-10-CM | POA: Diagnosis not present

## 2018-12-04 DIAGNOSIS — B351 Tinea unguium: Secondary | ICD-10-CM | POA: Diagnosis not present

## 2018-12-04 DIAGNOSIS — M79675 Pain in left toe(s): Secondary | ICD-10-CM | POA: Diagnosis not present

## 2018-12-04 DIAGNOSIS — M79674 Pain in right toe(s): Secondary | ICD-10-CM | POA: Diagnosis not present

## 2018-12-28 ENCOUNTER — Encounter: Payer: Self-pay | Admitting: Family Medicine

## 2019-01-10 ENCOUNTER — Ambulatory Visit: Payer: Medicare Other

## 2019-01-12 ENCOUNTER — Other Ambulatory Visit: Payer: Self-pay

## 2019-01-12 ENCOUNTER — Ambulatory Visit (INDEPENDENT_AMBULATORY_CARE_PROVIDER_SITE_OTHER): Payer: Medicare Other | Admitting: Family Medicine

## 2019-01-12 ENCOUNTER — Encounter: Payer: Self-pay | Admitting: Family Medicine

## 2019-01-12 ENCOUNTER — Ambulatory Visit: Payer: Medicare Other | Admitting: Family Medicine

## 2019-01-12 VITALS — BP 140/82 | HR 74 | Resp 15 | Ht 67.0 in | Wt 187.0 lb

## 2019-01-12 DIAGNOSIS — Z0000 Encounter for general adult medical examination without abnormal findings: Secondary | ICD-10-CM | POA: Diagnosis not present

## 2019-01-12 NOTE — Patient Instructions (Signed)
Gina Black , Thank you for taking time to come for your Medicare Wellness Visit. I appreciate your ongoing commitment to your health goals. Please review the following plan we discussed and let me know if I can assist you in the future.   Please continue to practice social distancing to keep you, your family, and our community safe.  If you must go out, please wear a Mask and practice good handwashing.  We hope that you have a happy, safe, healthy holiday season! See you in the new year!  Screening recommendations/referrals: Colonoscopy: Up-to-date  mammogram: Up-to-date  bone Density: Up-to-date Recommended yearly ophthalmology/optometry visit for glaucoma screening and checkup Recommended yearly dental visit for hygiene and checkup  Vaccinations: Influenza vaccine: Up-to-date  pneumococcal vaccine: Up-to-date Tdap vaccine: Up-to-date Shingles vaccine: Check coverage  Advanced directives: Discussed, please let us know if you do have the paperwork would like a copy of it.  Conditions/risks identified: Falls   Next appointment: 04/25/2019   Preventive Care 40-64 Years, Female Preventive care refers to lifestyle choices and visits with your health care provider that can promote health and wellness. What does preventive care include?  A yearly physical exam. This is also called an annual well check.  Dental exams once or twice a year.  Routine eye exams. Ask your health care provider how often you should have your eyes checked.  Personal lifestyle choices, including:  Daily care of your teeth and gums.  Regular physical activity.  Eating a healthy diet.  Avoiding tobacco and drug use.  Limiting alcohol use.  Practicing safe sex.  Taking low-dose aspirin daily starting at age 60.  Taking vitamin and mineral supplements as recommended by your health care provider. What happens during an annual well check? The services and screenings done by your health care  provider during your annual well check will depend on your age, overall health, lifestyle risk factors, and family history of disease. Counseling  Your health care provider may ask you questions about your:  Alcohol use.  Tobacco use.  Drug use.  Emotional well-being.  Home and relationship well-being.  Sexual activity.  Eating habits.  Work and work Statistician.  Method of birth control.  Menstrual cycle.  Pregnancy history. Screening  You may have the following tests or measurements:  Height, weight, and BMI.  Blood pressure.  Lipid and cholesterol levels. These may be checked every 5 years, or more frequently if you are over 26 years old.  Skin check.  Lung cancer screening. You may have this screening every year starting at age 34 if you have a 30-pack-year history of smoking and currently smoke or have quit within the past 15 years.  Fecal occult blood test (FOBT) of the stool. You may have this test every year starting at age 77.  Flexible sigmoidoscopy or colonoscopy. You may have a sigmoidoscopy every 5 years or a colonoscopy every 10 years starting at age 34.  Hepatitis C blood test.  Hepatitis B blood test.  Sexually transmitted disease (STD) testing.  Diabetes screening. This is done by checking your blood sugar (glucose) after you have not eaten for a while (fasting). You may have this done every 1-3 years.  Mammogram. This may be done every 1-2 years. Talk to your health care provider about when you should start having regular mammograms. This may depend on whether you have a family history of breast cancer.  BRCA-related cancer screening. This may be done if you have a family history of breast, ovarian,  tubal, or peritoneal cancers.  Pelvic exam and Pap test. This may be done every 3 years starting at age 107. Starting at age 36, this may be done every 5 years if you have a Pap test in combination with an HPV test.  Bone density scan. This is done  to screen for osteoporosis. You may have this scan if you are at high risk for osteoporosis. Discuss your test results, treatment options, and if necessary, the need for more tests with your health care provider. Vaccines  Your health care provider may recommend certain vaccines, such as:  Influenza vaccine. This is recommended every year.  Tetanus, diphtheria, and acellular pertussis (Tdap, Td) vaccine. You may need a Td booster every 10 years.  Zoster vaccine. You may need this after age 53.  Pneumococcal 13-valent conjugate (PCV13) vaccine. You may need this if you have certain conditions and were not previously vaccinated.  Pneumococcal polysaccharide (PPSV23) vaccine. You may need one or two doses if you smoke cigarettes or if you have certain conditions. Talk to your health care provider about which screenings and vaccines you need and how often you need them. This information is not intended to replace advice given to you by your health care provider. Make sure you discuss any questions you have with your health care provider. Document Released: 02/21/2015 Document Revised: 10/15/2015 Document Reviewed: 11/26/2014 Elsevier Interactive Patient Education  2017 [place] Prevention in the Home Falls can cause injuries. They can happen to people of all ages. There are many things you can do to make your home safe and to help prevent falls. What can I do on the outside of my home?  Regularly fix the edges of walkways and driveways and fix any cracks.  Remove anything that might make you trip as you walk through a door, such as a raised step or threshold.  Trim any bushes or trees on the path to your home.  Use bright outdoor lighting.  Clear any walking paths of anything that might make someone trip, such as rocks or tools.  Regularly check to see if handrails are loose or broken. Make sure that both sides of any steps have handrails.  Any raised decks and porches  should have guardrails on the edges.  Have any leaves, snow, or ice cleared regularly.  Use sand or salt on walking paths during winter.  Clean up any spills in your garage right away. This includes oil or grease spills. What can I do in the bathroom?  Use night lights.  Install grab bars by the toilet and in the tub and shower. Do not use towel bars as grab bars.  Use non-skid mats or decals in the tub or shower.  If you need to sit down in the shower, use a plastic, non-slip stool.  Keep the floor dry. Clean up any water that spills on the floor as soon as it happens.  Remove soap buildup in the tub or shower regularly.  Attach bath mats securely with double-sided non-slip rug tape.  Do not have throw rugs and other things on the floor that can make you trip. What can I do in the bedroom?  Use night lights.  Make sure that you have a light by your bed that is easy to reach.  Do not use any sheets or blankets that are too big for your bed. They should not hang down onto the floor.  Have a firm chair that has side arms.  You can use this for support while you get dressed.  Do not have throw rugs and other things on the floor that can make you trip. What can I do in the kitchen?  Clean up any spills right away.  Avoid walking on wet floors.  Keep items that you use a lot in easy-to-reach places.  If you need to reach something above you, use a strong step stool that has a grab bar.  Keep electrical cords out of the way.  Do not use floor polish or wax that makes floors slippery. If you must use wax, use non-skid floor wax.  Do not have throw rugs and other things on the floor that can make you trip. What can I do with my stairs?  Do not leave any items on the stairs.  Make sure that there are handrails on both sides of the stairs and use them. Fix handrails that are broken or loose. Make sure that handrails are as long as the stairways.  Check any carpeting to  make sure that it is firmly attached to the stairs. Fix any carpet that is loose or worn.  Avoid having throw rugs at the top or bottom of the stairs. If you do have throw rugs, attach them to the floor with carpet tape.  Make sure that you have a light switch at the top of the stairs and the bottom of the stairs. If you do not have them, ask someone to add them for you. What else can I do to help prevent falls?  Wear shoes that:  Do not have high heels.  Have rubber bottoms.  Are comfortable and fit you well.  Are closed at the toe. Do not wear sandals.  If you use a stepladder:  Make sure that it is fully opened. Do not climb a closed stepladder.  Make sure that both sides of the stepladder are locked into place.  Ask someone to hold it for you, if possible.  Clearly mark and make sure that you can see:  Any grab bars or handrails.  First and last steps.  Where the edge of each step is.  Use tools that help you move around (mobility aids) if they are needed. These include:  Canes.  Walkers.  Scooters.  Crutches.  Turn on the lights when you go into a dark area. Replace any light bulbs as soon as they burn out.  Set up your furniture so you have a clear path. Avoid moving your furniture around.  If any of your floors are uneven, fix them.  If there are any pets around you, be aware of where they are.  Review your medicines with your doctor. Some medicines can make you feel dizzy. This can increase your chance of falling. Ask your doctor what other things that you can do to help prevent falls. This information is not intended to replace advice given to you by your health care provider. Make sure you discuss any questions you have with your health care provider. Document Released: 11/21/2008 Document Revised: 07/03/2015 Document Reviewed: 03/01/2014 Elsevier Interactive Patient Education  2017 [employer].

## 2019-01-12 NOTE — Progress Notes (Signed)
Subjective:   Gina Black is a 63 y.o. female who presents for Medicare Annual (Subsequent) preventive examination.  Location of Patient: Home Location of Provider: Telehealth Consent was obtain for visit to be over via telehealth.  I verified that I am speaking with the correct person using two identifiers.   Review of Systems:    Cardiac Risk Factors include: dyslipidemia;hypertension     Objective:     Vitals: BP 140/82   Pulse 74   Resp 15   Ht 5\' 7"  (1.702 m)   Wt 187 lb (84.8 kg)   BMI 29.29 kg/m   Body mass index is 29.29 kg/m.  Advanced Directives 02/06/2018 01/03/2018 12/20/2016  Does Patient Have a Medical Advance Directive? No No No  Would patient like information on creating a medical advance directive? No - Patient declined Yes (ED - Information included in AVS) Yes (MAU/Ambulatory/Procedural Areas - Information given)    Tobacco Social History   Tobacco Use  Smoking Status Former Smoker  . Packs/day: 1.00  . Years: 6.00  . Pack years: 6.00  . Types: Cigarettes  . Quit date: 04/28/2010  . Years since quitting: 8.7  Smokeless Tobacco Never Used     Counseling given: Yes   Clinical Intake:  Pre-visit preparation completed: Yes  Pain : No/denies pain Pain Score: 0-No pain     BMI - recorded: 29.29 Nutritional Status: BMI 25 -29 Overweight Nutritional Risks: None Diabetes: No CBG done?: No Did pt. bring in CBG monitor from home?: No  How often do you need to have someone help you when you read instructions, pamphlets, or other written materials from your doctor or pharmacy?: 1 - Never What is the last grade level you completed in school?: 12  Interpreter Needed?: No     Past Medical History:  Diagnosis Date  . Hemorrhoids   . Hyperlipidemia   . Hypertension   . Psychotic disorder [facility]) August 2009   Hopitalised Involuntary commited by daughter   . S/P colonoscopy 03/20/2008   Dr Oneida Alar normal, except hemorrhoids   Past  Surgical History:  Procedure Laterality Date  . CHOLECYSTECTOMY  2001  . COLONOSCOPY N/A 02/06/2018   Procedure: COLONOSCOPY;  Surgeon: Danie Binder, MD;  [facility];  Service: Endoscopy;  Laterality: N/A;  1:15  . [place]   right   . POLYPECTOMY  02/06/2018   Procedure: POLYPECTOMY;  Surgeon: Danie Binder, MD;  Location: [facility];  Service: Endoscopy;;  ascending colon   Family History  Problem Relation Age of Onset  . Hypertension Mother   . Diabetes Mother   . Kidney disease Mother   . Diabetes Father   . Colon cancer Neg Hx   . Colon polyps Neg Hx    Social History   Socioeconomic History  . Marital status: Widowed    Spouse name: Not on file  . Number of children: 1  . Years of education: 22  . Highest education level: 12th grade  Occupational History  . Occupation: disabled    Comment: previously Civil engineer, contracting  . Financial resource strain: Not hard at all  . Food insecurity    Worry: Never true    Inability: Never true  . Transportation needs    Medical: No    Non-medical: No  Tobacco Use  . Smoking status: Former Smoker    Packs/day: 1.00    Years: 6.00    Pack years: 6.00  Types: Cigarettes    Quit date: 04/28/2010    Years since quitting: 8.7  . Smokeless tobacco: Never Used  Substance and Sexual Activity  . Alcohol use: No  . Drug use: No  . Sexual activity: Not Currently  Lifestyle  . Physical activity    Days per week: 4 days    Minutes per session: 30 min  . Stress: Not at all  Relationships  . Social connections    Talks on phone: More than three times a week    Gets together: More than three times a week    Attends religious service: Never    Active member of club or organization: No    Attends meetings of clubs or organizations: Never    Relationship status: Widowed  Other Topics Concern  . Not on file  Social History Narrative   Lives alone.  Daughter in [place].     Outpatient Encounter Medications as of 01/12/2019  Medication Sig  . Ascorbic Acid (VITAMIN C PO) Take 1 tablet by mouth daily.  . Calcium Carbonate-Vitamin D (CALCIUM 600+D PO) Take 1 tablet by mouth daily.  . ciclopirox (PENLAC) 8 % solution APPLY TO THE AFFECTED AREA ONCE DAILY.  . Multiple Vitamin (MULTIVITAMIN) tablet Take 1 tablet by mouth daily.  . potassium chloride (KLOR-CON) 10 MEQ tablet TAKE ONE TABLET BY MOUTH 2 TIMES A DAY.  Marland Kitchen triamterene-hydrochlorothiazide (MAXZIDE-25) 37.5-25 MG tablet Take 1 tablet by mouth daily.   No facility-administered encounter medications on file as of 01/12/2019.     Activities of Daily Living In your present state of health, do you have any difficulty performing the following activities: 01/12/2019  Hearing? N  Vision? N  Difficulty concentrating or making decisions? N  Walking or climbing stairs? N  Dressing or bathing? N  Doing errands, shopping? N  Preparing Food and eating ? N  Using the Toilet? N  In the past six months, have you accidently leaked urine? N  Do you have problems with loss of bowel control? N  Managing your Medications? N  Managing your Finances? N  Housekeeping or managing your Housekeeping? N  Some recent data might be hidden    Patient Care Team: Fayrene Helper, MD as PCP - General Danie Binder, MD (Gastroenterology) Rutherford Guys, MD as Consulting Physician (Ophthalmology)    Assessment:   This is a routine wellness examination for Gina Black.  Exercise Activities and Dietary recommendations Current Exercise Habits: Home exercise routine, Type of exercise: calisthenics;stretching, Time (Minutes): 20, Frequency (Times/Week): 4, Weekly Exercise (Minutes/Week): 80, Intensity: Mild, Exercise limited by: None identified  Goals    . DIET - INCREASE WATER INTAKE       Fall Risk Fall Risk  01/12/2019 10/26/2018 03/14/2018 01/03/2018 12/20/2016  Falls in the past year? 0 0 0 0 No  Number falls in past yr: 0 0 0  - -  Injury with Fall? 0 0 0 - -   Is the patient's home free of loose throw rugs in walkways, pet beds, electrical cords, etc?   yes      Grab bars in the bathroom? yes      Handrails on the stairs?   yes      Adequate lighting?   yes     Depression Screen PHQ 2/9 Scores 01/12/2019 10/26/2018 03/14/2018 01/03/2018  PHQ - 2 Score 0 0 0 0     Cognitive Function     6CIT Screen 01/12/2019 01/03/2018 12/20/2016  What Year? 0 points  0 points 0 points  What month? 0 points 0 points 0 points  What time? 0 points 0 points 0 points  Count back from 20 0 points 0 points 0 points  Months in reverse 0 points 0 points 0 points  Repeat phrase 0 points 0 points 0 points  Total Score 0 0 0    Immunization History  Administered Date(s) Administered  . H1N1 02/07/2008  . Influenza Split 12/20/2011  . Influenza Whole 11/19/2009  . Influenza,inj,Quad PF,6+ Mos 12/18/2012, 11/19/2013, 01/16/2015, 11/20/2015, 12/20/2016, 10/13/2017, 10/26/2018  . Pneumococcal Conjugate-13 09/12/2013  . Pneumococcal Polysaccharide-23 01/03/2018  . Td 07/24/2003  . Tdap 12/18/2012    Qualifies for Shingles Vaccine?   Screening Tests Health Maintenance  Topic Date Due  . PAP SMEAR-Modifier  09/25/2019  . MAMMOGRAM  01/13/2020  . TETANUS/TDAP  12/19/2022  . COLONOSCOPY  02/07/2028  . INFLUENZA VACCINE  Completed  . Hepatitis C Screening  Completed  . HIV Screening  Completed    Cancer Screenings: Lung: Low Dose CT Chest recommended if Age 61-80 years, 30 pack-year currently smoking OR have quit w/in 15years. Patient does not qualify. Breast:  Up to date on Mammogram? Yes   Up to date of Bone Density/Dexa? Yes Colorectal:  Due 2029  Additional Screenings:  Hepatitis C Screening:  completed     Plan:        1. Encounter for Medicare annual wellness exam   I have personally reviewed and noted the following in the patient's chart:   . Medical and social history . Use of alcohol, tobacco or  illicit drugs  . Current medications and supplements . Functional ability and status . Nutritional status . Physical activity . Advanced directives . List of other physicians . Hospitalizations, surgeries, and ER visits in previous 12 months . Vitals . Screenings to include cognitive, depression, and falls . Referrals and appointments  In addition, I have reviewed and discussed with patient certain preventive protocols, quality metrics, and best practice recommendations. A written personalized care plan for preventive services as well as general preventive health recommendations were provided to patient.     I provided 20 minutes of non-face-to-face time during this encounter.    Perlie Mayo, NP  01/12/2019

## 2019-01-30 ENCOUNTER — Other Ambulatory Visit: Payer: Self-pay | Admitting: Family Medicine

## 2019-02-05 ENCOUNTER — Ambulatory Visit: Payer: Medicare Other

## 2019-04-11 ENCOUNTER — Other Ambulatory Visit: Payer: Self-pay | Admitting: Family Medicine

## 2019-04-11 DIAGNOSIS — E559 Vitamin D deficiency, unspecified: Secondary | ICD-10-CM | POA: Diagnosis not present

## 2019-04-11 DIAGNOSIS — I10 Essential (primary) hypertension: Secondary | ICD-10-CM | POA: Diagnosis not present

## 2019-04-11 DIAGNOSIS — E785 Hyperlipidemia, unspecified: Secondary | ICD-10-CM | POA: Diagnosis not present

## 2019-04-12 ENCOUNTER — Encounter: Payer: Self-pay | Admitting: Family Medicine

## 2019-04-12 LAB — COMPREHENSIVE METABOLIC PANEL
ALT: 18 IU/L (ref 0–32)
AST: 21 IU/L (ref 0–40)
Albumin/Globulin Ratio: 1.3 (ref 1.2–2.2)
Albumin: 4.2 g/dL (ref 3.8–4.8)
Alkaline Phosphatase: 59 IU/L (ref 39–117)
BUN/Creatinine Ratio: 15 (ref 12–28)
BUN: 13 mg/dL (ref 8–27)
Bilirubin Total: 0.7 mg/dL (ref 0.0–1.2)
CO2: 25 mmol/L (ref 20–29)
Calcium: 9.3 mg/dL (ref 8.7–10.3)
Chloride: 105 mmol/L (ref 96–106)
Creatinine, Ser: 0.84 mg/dL (ref 0.57–1.00)
GFR calc Af Amer: 85 mL/min/{1.73_m2} (ref >59)
GFR calc non Af Amer: 74 mL/min/{1.73_m2} (ref >59)
Globulin, Total: 3.2 g/dL (ref 1.5–4.5)
Glucose: 79 mg/dL (ref 65–99)
Potassium: 3.9 mmol/L (ref 3.5–5.2)
Sodium: 141 mmol/L (ref 134–144)
Total Protein: 7.4 g/dL (ref 6.0–8.5)

## 2019-04-12 LAB — CBC/DIFF AMBIGUOUS DEFAULT
Basophils Absolute: 0 10*3/uL (ref 0.0–0.2)
Basos: 0 %
EOS (ABSOLUTE): 0.2 10*3/uL (ref 0.0–0.4)
Eos: 4 %
Hematocrit: 38.9 % (ref 34.0–46.6)
Hemoglobin: 13.2 g/dL (ref 11.1–15.9)
Immature Grans (Abs): 0 10*3/uL (ref 0.0–0.1)
Immature Granulocytes: 0 %
Lymphocytes Absolute: 1.7 10*3/uL (ref 0.7–3.1)
Lymphs: 32 %
MCH: 28.2 pg (ref 26.6–33.0)
MCHC: 33.9 g/dL (ref 31.5–35.7)
MCV: 83 fL (ref 79–97)
Monocytes Absolute: 0.6 10*3/uL (ref 0.1–0.9)
Monocytes: 11 %
Neutrophils Absolute: 2.7 10*3/uL (ref 1.4–7.0)
Neutrophils: 53 %
Platelets: 183 10*3/uL (ref 150–450)
RBC: 4.68 x10E6/uL (ref 3.77–5.28)
RDW: 13.2 % (ref 11.7–15.4)
WBC: 5.2 10*3/uL (ref 3.4–10.8)

## 2019-04-12 LAB — LIPID PANEL W/O CHOL/HDL RATIO
Cholesterol, Total: 203 mg/dL — ABNORMAL HIGH (ref 100–199)
HDL: 70 mg/dL (ref >39)
LDL Chol Calc (NIH): 124 mg/dL — ABNORMAL HIGH (ref 0–99)
Triglycerides: 47 mg/dL (ref 0–149)
VLDL Cholesterol Cal: 9 mg/dL (ref 5–40)

## 2019-04-12 LAB — TSH: TSH: 1.17 u[IU]/mL (ref 0.450–4.500)

## 2019-04-12 LAB — SPECIMEN STATUS REPORT

## 2019-04-12 LAB — VITAMIN D 25 HYDROXY (VIT D DEFICIENCY, FRACTURES): Vit D, 25-Hydroxy: 30.7 ng/mL (ref 30.0–100.0)

## 2019-04-18 ENCOUNTER — Ambulatory Visit: Payer: Medicare Other | Admitting: Family Medicine

## 2019-04-25 ENCOUNTER — Ambulatory Visit: Payer: Medicare Other | Admitting: Family Medicine

## 2019-04-30 ENCOUNTER — Other Ambulatory Visit: Payer: Self-pay

## 2019-04-30 ENCOUNTER — Encounter (HOSPITAL_COMMUNITY): Payer: Self-pay | Admitting: Emergency Medicine

## 2019-04-30 ENCOUNTER — Emergency Department (HOSPITAL_COMMUNITY): Payer: Medicare Other

## 2019-04-30 ENCOUNTER — Emergency Department (HOSPITAL_COMMUNITY)
Admission: EM | Admit: 2019-04-30 | Discharge: 2019-04-30 | Disposition: A | Discharge status: Home / Self Care | Payer: Medicare Other | Attending: Emergency Medicine | Admitting: Emergency Medicine

## 2019-04-30 DIAGNOSIS — W06XXXA Fall from bed, initial encounter: Secondary | ICD-10-CM | POA: Insufficient documentation

## 2019-04-30 DIAGNOSIS — Y9389 Activity, other specified: Secondary | ICD-10-CM | POA: Diagnosis not present

## 2019-04-30 DIAGNOSIS — Y999 Unspecified external cause status: Secondary | ICD-10-CM | POA: Diagnosis not present

## 2019-04-30 DIAGNOSIS — Y929 Unspecified place or not applicable: Secondary | ICD-10-CM | POA: Diagnosis not present

## 2019-04-30 DIAGNOSIS — S99922A Unspecified injury of left foot, initial encounter: Secondary | ICD-10-CM | POA: Diagnosis present

## 2019-04-30 DIAGNOSIS — S92002A Unspecified fracture of left calcaneus, initial encounter for closed fracture: Secondary | ICD-10-CM

## 2019-04-30 DIAGNOSIS — W19XXXA Unspecified fall, initial encounter: Secondary | ICD-10-CM

## 2019-04-30 MED ORDER — OXYCODONE-ACETAMINOPHEN 5-325 MG PO TABS: 1.0000 | ORAL_TABLET | ORAL | 0 refills | Status: DC | PRN

## 2019-04-30 NOTE — ED Triage Notes (Signed)
Pt reports left ankle pain this am. Pt reports was standing on the bed and changing a light bulb, pt reports slipped off and landed on hands and bilateral feet. Pt reports left foot pain ever since. Pt denies hitting head,loc, syncope. No obvious deformity noted.

## 2019-04-30 NOTE — ED Provider Notes (Addendum)
[facility] EMERGENCY DEPARTMENT Provider Note   CSN: [id number] Arrival date & time: 04/30/19  1156     History Chief Complaint  Patient presents with  . Ankle Pain    Gina Black is a 64 y.o. female.  Left heel pain after falling off the bed while changing a light bulb just prior to visit.  No other injuries.  Severity of pain is moderate to severe.  Palpation makes pain worse.        Past Medical History:  Diagnosis Date  . Hemorrhoids   . Hyperlipidemia   . Hypertension   . Psychotic disorder [facility]) August 2009   Hopitalised Involuntary commited by daughter   . S/P colonoscopy 03/20/2008   Dr Oneida Alar normal, except hemorrhoids    Patient Active Problem List   Diagnosis Date Noted  . Overweight (BMI 25.0-29.9) 03/18/2018  . Adenomatous polyp of colon 09/25/2016  . Plantar callus 09/25/2016  . Need for immunization against influenza 11/23/2015  . Decreased vision 08/22/2015  . Dyslipidemia 07/11/2011  . Psychotic disorder with delusions ([place]) 05/15/2010  . Vitamin D deficiency 02/25/2009  . Essential hypertension 09/05/2007    Past Surgical History:  Procedure Laterality Date  . CHOLECYSTECTOMY  2001  . COLONOSCOPY N/A 02/06/2018   Procedure: COLONOSCOPY;  Surgeon: Danie Binder, MD;  [facility];  Service: Endoscopy;  Laterality: N/A;  1:15  . [place]   right   . POLYPECTOMY  02/06/2018   Procedure: POLYPECTOMY;  Surgeon: Danie Binder, MD;  Location: [facility];  Service: Endoscopy;;  ascending colon     OB History   No obstetric history on file.     Family History  Problem Relation Age of Onset  . Hypertension Mother   . Diabetes Mother   . Kidney disease Mother   . Diabetes Father   . Colon cancer Neg Hx   . Colon polyps Neg Hx     Social History   Tobacco Use  . Smoking status: Former Smoker    Packs/day: 1.00    Years: 6.00    Pack years: 6.00    Types: Cigarettes    Quit date:  04/28/2010    Years since quitting: 9.0  . Smokeless tobacco: Never Used  Substance Use Topics  . Alcohol use: No  . Drug use: No    Home Medications Prior to Admission medications   Medication Sig Start Date End Date Taking? Authorizing Provider  Ascorbic Acid (VITAMIN C PO) Take 1 tablet by mouth daily.    [provider]  Calcium Carbonate-Vitamin D (CALCIUM 600+D PO) Take 1 tablet by mouth daily.    [provider]  ciclopirox (PENLAC) 8 % solution APPLY TO THE AFFECTED AREA ONCE DAILY. 01/30/19   Fayrene Helper, MD  Multiple Vitamin (MULTIVITAMIN) tablet Take 1 tablet by mouth daily.    [provider]  oxyCODONE-acetaminophen (PERCOCET) 5-325 MG tablet Take 1 tablet by mouth every 4 (four) hours as needed. 04/30/19   Nat Christen, MD  potassium chloride (KLOR-CON) 10 MEQ tablet TAKE ONE TABLET BY MOUTH 2 TIMES A DAY. 11/22/18   Fayrene Helper, MD  triamterene-hydrochlorothiazide (MAXZIDE-25) 37.5-25 MG tablet Take 1 tablet by mouth daily. 10/29/18   Fayrene Helper, MD    Allergies    Ciprofloxacin and Prednisone  Review of Systems   Review of Systems  All other systems reviewed and are negative.   Physical Exam Updated Vital Signs BP Marland Kitchen)  162/86 (BP Location: Right Arm)   Pulse 100   Temp 99.3 F (37.4 C) (Oral)   Resp 18   Ht 5\' 7"  (1.702 m)   Wt 69.9 kg   SpO2 99%   BMI 24.12 kg/m   Physical Exam Vitals and nursing note reviewed.  Constitutional:      Appearance: She is well-developed.  HENT:     Head: Normocephalic and atraumatic.  Eyes:     Conjunctiva/sclera: Conjunctivae normal.  Cardiovascular:     Rate and Rhythm: Normal rate and regular rhythm.  Pulmonary:     Effort: Pulmonary effort is normal.     Breath sounds: Normal breath sounds.  Abdominal:     General: Bowel sounds are normal.     Palpations: Abdomen is soft.  Musculoskeletal:     Cervical back: Neck supple.     Comments: Left foot: Tenderness  surrounding the entire calcaneus  Skin:    General: Skin is warm and dry.  Neurological:     General: No focal deficit present.     Mental Status: She is alert and oriented to person, place, and time.  Psychiatric:        Behavior: Behavior normal.     ED Results / Procedures / Treatments   Labs (all labs ordered are listed, but only abnormal results are displayed) Labs Reviewed - No data to display  EKG None  Radiology CT Foot Left Wo Contrast  Result Date: 04/30/2019 CLINICAL DATA:  Calcaneal fractures. EXAM: CT OF THE LEFT FOOT WITHOUT CONTRAST TECHNIQUE: Multidetector CT imaging of the left foot was performed according to the standard protocol. Multiplanar CT image reconstructions were also generated. COMPARISON:  Radiographs, same date. FINDINGS: Complex comminuted mixed type calcaneal fracture with tongue and central depression components. Comminuted fractures through the body of the calcaneus extending out through the posterior and plantar cortices. There is also a comminuted intra-articular and mildly displaced fracture involving the posterior calcaneal facet but no significant depression. The posterior talocalcaneal facet joint is maintained. The middle facet and sustentaculum are maintained. No fracture of the sustentacular limb or of the anterior process. The tibiotalar joint is maintained. No fracture of the tibia, fibula or talus. The navicular bone is intact and the midfoot and forefoot bony structures are intact. IMPRESSION: 1. Complex comminuted mixed type calcaneal fracture. 2. Comminuted intra-articular and mildly displaced fracture involving the posterior calcaneal facet but no significant depression. 3. No other fractures are identified. Electronically Signed   By: Marijo Sanes M.D.   On: 04/30/2019 13:41   DG Foot Complete Left  Result Date: 04/30/2019 CLINICAL DATA:  Left foot pain since falling off of a bed today. EXAM: LEFT FOOT - COMPLETE 3+ VIEW COMPARISON:  None.  FINDINGS: There is a comminuted fracture of the calcaneus. The other bones of the foot are intact. Slight bunion formation on the head of the first metatarsal. IMPRESSION: Comminuted fracture of the calcaneus. Electronically Signed   By: Lorriane Shire M.D.   On: 04/30/2019 12:27    Procedures Procedures (including critical care time)  Medications Ordered in ED Medications - No data to display  ED Course  I have reviewed the triage vital signs and the nursing notes.  Pertinent labs & imaging results that were available during my care of the patient were reviewed by me and considered in my medical decision making (see chart for details).    MDM Rules/Calculators/A&P  Plain films reveal a comminuted fracture of the left calcaneus.  Will obtain CT of the foot and consult orthopedics.  1615: Discussed x-ray results with orthopedic surgeon on-call Dr. Marlou Sa.  He recommended immobilization with a posterior splint, crutches, follow-up with Dr. Sharol Given. Final Clinical Impression(s) / ED Diagnoses Final diagnoses:  Fall  Closed nondisplaced fracture of left calcaneus, unspecified portion of calcaneus, initial encounter    Rx / DC Orders ED Discharge Orders         Ordered    oxyCODONE-acetaminophen (PERCOCET) 5-325 MG tablet  Every 4 hours PRN     04/30/19 1652           Nat Christen, MD 04/30/19 1309    Nat Christen, MD 04/30/19 1654

## 2019-04-30 NOTE — ED Notes (Signed)
Pt called RN to room.  Pt says she doesn't need a social work consult.  Pt says she thinks the reason she can't walk with the crutches is because she has sandals on.  Pt says she thinks she will be ok using them in her own. Home.  EDP notified and pt discharged.

## 2019-04-30 NOTE — ED Notes (Signed)
Pt attempted to walk with crutches in room and almost fell x 2.  Notified EDP pt uncomfortable with crutches.  EDP placing social work consult.

## 2019-04-30 NOTE — Discharge Instructions (Addendum)
You have broken a bone called the calcaneus.  This is a serious fracture.  We have put you in an immobilizing device in the emergency department.  Crutches.  Pain medicine called to your pharmacy.  Call the orthopedic doctor on the list for follow-up.

## 2019-05-01 ENCOUNTER — Ambulatory Visit (INDEPENDENT_AMBULATORY_CARE_PROVIDER_SITE_OTHER): Payer: Medicare Other | Admitting: Family Medicine

## 2019-05-01 ENCOUNTER — Encounter: Payer: Self-pay | Admitting: Family Medicine

## 2019-05-01 ENCOUNTER — Other Ambulatory Visit: Payer: Self-pay

## 2019-05-01 VITALS — BP 169/90 | Ht 67.0 in | Wt 154.0 lb

## 2019-05-01 DIAGNOSIS — S92002A Unspecified fracture of left calcaneus, initial encounter for closed fracture: Secondary | ICD-10-CM | POA: Diagnosis not present

## 2019-05-01 NOTE — Assessment & Plan Note (Signed)
Follow-up with Dr. Jess Barters office on May 07, 2019.  Ordered home health PT and wheelchair today.  Overall she reports pain is controlled with pain medicine that she was given in the emergency room.

## 2019-05-01 NOTE — Patient Instructions (Addendum)
I appreciate the opportunity to provide you with care for your health and wellness. Today we discussed: Left heel fracture  Follow up: 6 months   No labs or referrals today  I have ordered home health referral for physical therapy and a wheelchair. Hope you feel better soon please be careful with moving.  Please continue to practice social distancing to keep you, your family, and our community safe.  If you must go out, please wear a mask and practice good handwashing.  It was a pleasure to see you and I look forward to continuing to work together on your health and well-being. Please do not hesitate to call the office if you need care or have questions about your care.  Have a wonderful day and week. With Gratitude, Cherly Beach, DNP, AGNP-BC

## 2019-05-01 NOTE — Progress Notes (Signed)
Virtual Visit via Telephone Note   This visit type was conducted due to national recommendations for restrictions regarding the COVID-19 Pandemic (e.g. social distancing) in an effort to limit this patient's exposure and mitigate transmission in our community.  Due to her co-morbid illnesses, this patient is at least at moderate risk for complications without adequate follow up.  This format is felt to be most appropriate for this patient at this time.  The patient did not have access to video technology/had technical difficulties with video requiring transitioning to audio format only (telephone).  All issues noted in this document were discussed and addressed.  No physical exam could be performed with this format.    Evaluation Performed:  Follow-up visit  Date:  05/01/2019   ID:  Gina Black, DOB 12-Dec-1955, MRN [id number]  Patient Location: Home Provider Location: Office  Location of Patient: Home Location of Provider: Telehealth Consent was obtain for visit to be over via telehealth. I verified that I am speaking with the correct person using two identifiers.  PCP:  Fayrene Helper, MD   Chief Complaint: Follow-up after healed fracture and emergency room visit  History of Present Illness:    Gina Black is a 64 y.o. female with history of hyperlipidemia, hypertension, psychotic disorder, hemorrhoids among others.  Presented to the emergency room on March 22 secondary to falling off the bed while changing a light bulb just prior to going to the emergency room.  Left heel pain immediately after this fall.  No other injuries to report.  Severity of pain was listed as moderate to severe.  And touching/palpitation made it much worse.  X-ray in the emergency room demonstrated comminuted fracture with left calcaneus.  Orthopedics was consulted.  Patient was immobilized with a posterior splint and crutches and to follow-up with Dr. Sharol Given.  Has follow-up with orthopedics on  March 29.  Reports that her pain is well controlled with the pain medicine as long as she make sure she takes it.  Is having a lot of trouble with mobility issues.  Would like to see if she can get a wheelchair to help with this as crutches or a little bit unmanageable.  She is open to having home health come in to help her with learning how to move with these. At this time she is unable to bear weight.  The patient does not have symptoms concerning for COVID-19 infection (fever, chills, cough, or new shortness of breath).   Past Medical, Surgical, Social History, Allergies, and Medications have been Reviewed.  Past Medical History:  Diagnosis Date  . Hemorrhoids   . Hyperlipidemia   . Hypertension   . Psychotic disorder [facility]) August 2009   Hopitalised Involuntary commited by daughter   . S/P colonoscopy 03/20/2008   Dr Oneida Alar normal, except hemorrhoids   Past Surgical History:  Procedure Laterality Date  . CHOLECYSTECTOMY  2001  . COLONOSCOPY N/A 02/06/2018   Procedure: COLONOSCOPY;  Surgeon: Danie Binder, MD;  [facility];  Service: Endoscopy;  Laterality: N/A;  1:15  . [place]   right   . POLYPECTOMY  02/06/2018   Procedure: POLYPECTOMY;  Surgeon: Danie Binder, MD;  [facility];  Service: Endoscopy;;  ascending colon     Current Meds  Medication Sig  . Ascorbic Acid (VITAMIN C PO) Take 1 tablet by mouth daily.  . Calcium Carbonate-Vitamin D (CALCIUM 600+D PO) Take 1 tablet by mouth daily.  Marland Kitchen  ciclopirox (PENLAC) 8 % solution APPLY TO THE AFFECTED AREA ONCE DAILY.  . Multiple Vitamin (MULTIVITAMIN) tablet Take 1 tablet by mouth daily.  Marland Kitchen oxyCODONE-acetaminophen (PERCOCET) 5-325 MG tablet Take 1 tablet by mouth every 4 (four) hours as needed.  . potassium chloride (KLOR-CON) 10 MEQ tablet TAKE ONE TABLET BY MOUTH 2 TIMES A DAY.  Marland Kitchen triamterene-hydrochlorothiazide (MAXZIDE-25) 37.5-25 MG tablet Take 1 tablet by mouth daily.      Allergies:   Ciprofloxacin and Prednisone   ROS:   Please see the history of present illness.    All other systems reviewed and are negative.   Labs/Other Tests and Data Reviewed:    Recent Labs: 04/11/2019: ALT 18; BUN 13; Creatinine, Ser 0.84; Hemoglobin 13.2; Platelets 183; Potassium 3.9; Sodium 141; TSH 1.170   Recent Lipid Panel Lab Results  Component Value Date/Time   CHOL 203 (H) 04/11/2019 03:39 PM   TRIG 47 04/11/2019 03:39 PM   HDL 70 04/11/2019 03:39 PM   CHOLHDL 3.3 10/15/2017 10:46 AM   CHOLHDL 2.8 09/23/2016 10:25 AM   LDLCALC 124 (H) 04/11/2019 03:39 PM    Wt Readings from Last 3 Encounters:  05/01/19 154 lb (69.9 kg)  04/30/19 154 lb (69.9 kg)  01/12/19 187 lb (84.8 kg)     Objective:    Vital Signs:  BP (!) 169/90   Ht 5\' 7"  (1.702 m)   Wt 154 lb (69.9 kg)   BMI 24.12 kg/m    VITAL SIGNS:  reviewed GEN:  Alert and oriented, no acute distress in conversation RESPIRATORY:  No shortness of breath in conversation PSYCH:  Flat and withdrawn.  ASSESSMENT & PLAN:    1. Closed nondisplaced fracture of left calcaneus, unspecified portion of calcaneus, initial encounter  - Ambulatory referral to [facility] - DME Wheelchair manual   Time:   Today, I have spent 10 minutes with the patient with telehealth technology discussing the above problems.     Medication Adjustments/Labs and Tests Ordered: Current medicines are reviewed at length with the patient today.  Concerns regarding medicines are outlined above.   Tests Ordered: No orders of the defined types were placed in this encounter.   Medication Changes: No orders of the defined types were placed in this encounter.   Disposition:  Follow up as scheduled Signed, Perlie Mayo, NP  05/01/2019 2:53 PM     [facility]

## 2019-05-07 ENCOUNTER — Ambulatory Visit: Payer: Medicare Other | Admitting: Physician Assistant

## 2019-05-09 ENCOUNTER — Other Ambulatory Visit: Payer: Self-pay

## 2019-05-09 ENCOUNTER — Encounter: Payer: Self-pay | Admitting: Physician Assistant

## 2019-05-09 ENCOUNTER — Ambulatory Visit: Payer: Medicare Other | Admitting: Physician Assistant

## 2019-05-09 VITALS — Ht 67.0 in | Wt 154.0 lb

## 2019-05-09 DIAGNOSIS — S92002A Unspecified fracture of left calcaneus, initial encounter for closed fracture: Secondary | ICD-10-CM

## 2019-05-09 NOTE — Progress Notes (Signed)
Office Visit Note   Patient: Gina Black           Date of Birth: [birth year]           MRN: [id number] Visit Date: 05/09/2019              Requested by: Fayrene Helper, MD [address] [place],  [place] [address] PCP: Fayrene Helper, MD  Chief Complaint  Patient presents with  . Left Foot - Pain    F/U ER visit left calcaneal fx       HPI: This is a pleasant 64 year old woman who is 9 days status post falling while trying to change a light bulb.  She was seen and evaluated in the emergency room and x-rays and CT findings were consistent with a left calcaneus fracture.  She has been in a splint and elevating and nonweightbearing.  She is a former smoker.  She has only taken 2-3 Percocet since the injury  Assessment & Plan: Visit Diagnoses: No diagnosis found.  Plan: She will be placed in a cam walker boot today and will be remained nonweightbearing.  I would like for her to take 1 aspirin daily for prevention of DVT given her immobilization she will follow-up in 2 weeks x-rays of her calcaneus including an axial view should be taken at that time.  I discussed with her the natural history of this injury as well as recovery.  She understands that she is at a high risk for subtalar arthritis and at some point if she had continued pain she might require a subtalar arthrodesis  Follow-Up Instructions: No follow-ups on file.   Ortho Exam  Patient is alert, oriented, no adenopathy, well-dressed, normal affect, normal respiratory effort. Left lower extremity skin is in good condition swelling is well controlled.  She has no blistering.  She has a strong dorsalis pedis posterior tibial pulses.  Compartments are soft and compressible she has no calf pain she has a negative Homans test no cellulitis CT scan demonstrates comminuted calcaneal fracture comminuted intra-articular and mildly displaced fracture involving the posterior calcaneal facet but no significant  depression  Imaging: No results found. No images are attached to the encounter.  Labs: Lab Results  Component Value Date   LABORGA PROTEUS MIRABILIS 07/29/2014     Lab Results  Component Value Date   ALBUMIN 4.2 04/11/2019   ALBUMIN 4.2 03/18/2018   ALBUMIN 4.5 10/15/2017    No results found for: MG Lab Results  Component Value Date   VD25OH 30.7 04/11/2019   VD25OH 26.3 (L) 10/15/2017   VD25OH 30 09/23/2016    No results found for: PREALBUMIN CBC EXTENDED Latest Ref Rng & Units 04/11/2019 10/15/2017 09/23/2016  WBC 3.4 - 10.8 x10E3/uL 5.2 4.9 4.9  RBC 3.77 - 5.28 x10E6/uL 4.68 4.81 4.74  HGB 11.1 - 15.9 g/dL 13.2 12.9 12.8  HCT 34.0 - 46.6 % 38.9 40.4 39.3  PLT 150 - 450 x10E3/uL 183 218 217  NEUTROABS 1.4 - 7.0 x10E3/uL 2.7 - -  LYMPHSABS 0.7 - 3.1 x10E3/uL 1.7 - -     Body mass index is 24.12 kg/m.  Orders:  No orders of the defined types were placed in this encounter.  No orders of the defined types were placed in this encounter.    Procedures: No procedures performed  Clinical Data: No additional findings.  ROS:  All other systems negative, except as noted in the HPI. Review of Systems  Objective: Vital Signs:  Ht 5\' 7"  (1.702 m)   Wt 154 lb (69.9 kg)   BMI 24.12 kg/m   Specialty Comments:  No specialty comments available.  PMFS History: Patient Active Problem List   Diagnosis Date Noted  . Closed nondisplaced fracture of left calcaneus 05/01/2019  . Overweight (BMI 25.0-29.9) 03/18/2018  . Adenomatous polyp of colon 09/25/2016  . Plantar callus 09/25/2016  . Decreased vision 08/22/2015  . Dyslipidemia 07/11/2011  . Psychotic disorder with delusions ([place]) 05/15/2010  . Vitamin D deficiency 02/25/2009  . Essential hypertension 09/05/2007   Past Medical History:  Diagnosis Date  . Hemorrhoids   . Hyperlipidemia   . Hypertension   . Need for immunization against influenza 11/23/2015  . Psychotic disorder [facility]) August 2009    Hopitalised Involuntary commited by daughter   . S/P colonoscopy 03/20/2008   Dr Oneida Alar normal, except hemorrhoids    Family History  Problem Relation Age of Onset  . Hypertension Mother   . Diabetes Mother   . Kidney disease Mother   . Diabetes Father   . Colon cancer Neg Hx   . Colon polyps Neg Hx     Past Surgical History:  Procedure Laterality Date  . CHOLECYSTECTOMY  2001  . COLONOSCOPY N/A 02/06/2018   Procedure: COLONOSCOPY;  Surgeon: Danie Binder, MD;  [facility];  Service: Endoscopy;  Laterality: N/A;  1:15  . [place]   right   . POLYPECTOMY  02/06/2018   Procedure: POLYPECTOMY;  Surgeon: Danie Binder, MD;  [facility];  Service: Endoscopy;;  ascending colon   Social History   Occupational History  . Occupation: disabled    Comment: previously cook  Tobacco Use  . Smoking status: Former Smoker    Packs/day: 1.00    Years: 6.00    Pack years: 6.00    Types: Cigarettes    Quit date: 04/28/2010    Years since quitting: 9.0  . Smokeless tobacco: Never Used  Substance and Sexual Activity  . Alcohol use: No  . Drug use: No  . Sexual activity: Not Currently

## 2019-05-10 ENCOUNTER — Telehealth: Payer: Self-pay | Admitting: Physician Assistant

## 2019-05-10 NOTE — Telephone Encounter (Signed)
Tried calling patient to discuss. No answer. No VM to LM.  

## 2019-05-10 NOTE — Telephone Encounter (Signed)
Patient called requesting a call back. Patient wanted to know if a wheelchair was order. Patient phone number is [phone]. Autumn F. Please give patient a call.

## 2019-05-11 ENCOUNTER — Ambulatory Visit: Payer: Medicare Other | Attending: Internal Medicine

## 2019-05-11 DIAGNOSIS — Z23 Encounter for immunization: Secondary | ICD-10-CM

## 2019-05-11 NOTE — Progress Notes (Signed)
   Covid-19 Vaccination Clinic  Name:  Gina Black    MRN: [id number] DOB: 07-Aug-1955  05/11/2019  [name] was observed post Covid-19 immunization for 15 minutes without incident. She was provided with Vaccine Information Sheet and instruction to access the V-Safe system.   [name] was instructed to call 911 with any severe reactions post vaccine: Marland Kitchen Difficulty breathing  . Swelling of face and throat  . A fast heartbeat  . A bad rash all over body  . Dizziness and weakness   Immunizations Administered    Name Date Dose VIS Date Route   Moderna COVID-19 Vaccine 05/11/2019  9:18 AM 0.5 mL 01/09/2019 Intramuscular   Manufacturer: Moderna   Lot: [id number]   [id number]

## 2019-05-14 NOTE — Telephone Encounter (Signed)
Message to [employer] to check the status of the wheelchair that was ordered on the day of patient's appointment last week. Will hold message pending advisement.

## 2019-05-14 NOTE — Telephone Encounter (Signed)
I called pt to advise that [employer] has been trying to reach the patient to set up a payment arrangement and delivery options. The number to call is [phone] or the other number is [phone]. I advised the pt that she can call provide them with needed information. The pt states that she does not want to use [employer] she wants to use [facility] because she was advised that the wheelchair will not be delivered to her home with [employer]. I tried to advise the pt that I did submit the order as delivery and she advised she still wants the order to be canceled with [employer] and faxed to [employer]. Advised that I will fax this in as requested . Fax numbers are [phone] and [phone]

## 2019-05-15 ENCOUNTER — Other Ambulatory Visit: Payer: Self-pay

## 2019-05-15 NOTE — Telephone Encounter (Signed)
Completed.

## 2019-05-18 ENCOUNTER — Telehealth: Payer: Self-pay | Admitting: Orthopedic Surgery

## 2019-05-18 NOTE — Telephone Encounter (Signed)
Patient called requesting whee chair ordered by Dr. Sharol Given be sent to new pharmacy. Pharmacy wheel chair sent to is to far and patient can not pick it up. Patient is requesting for wheel chair to be sent to [facility] address [place] [address] Phone number [phone] Fax number is [phone]

## 2019-05-21 ENCOUNTER — Other Ambulatory Visit: Payer: Self-pay

## 2019-05-21 DIAGNOSIS — S92002A Unspecified fracture of left calcaneus, initial encounter for closed fracture: Secondary | ICD-10-CM

## 2019-05-21 NOTE — Telephone Encounter (Signed)
I printed letter and stamped for wheelchair for this pt and faxed to [employer] do you still have this? I can reprint a new one to fax but pt is calling asking for this to be sent and it was done a week ago so they did not receive it. Just let me know what I need to do. Thanks

## 2019-05-21 NOTE — Telephone Encounter (Signed)
I did fax this on 4/7 to same fax number. I just re faxed.

## 2019-05-21 NOTE — Telephone Encounter (Signed)
I tried to reach out to pt and there is no answer and no voicemail option. This was faxed on 05/16/19 and then again today. Wheelchair for [employer].

## 2019-05-24 ENCOUNTER — Other Ambulatory Visit: Payer: Self-pay

## 2019-05-24 ENCOUNTER — Ambulatory Visit (INDEPENDENT_AMBULATORY_CARE_PROVIDER_SITE_OTHER): Payer: Medicare Other | Admitting: Orthopaedic Surgery

## 2019-05-24 ENCOUNTER — Encounter: Payer: Self-pay | Admitting: Orthopaedic Surgery

## 2019-05-24 ENCOUNTER — Ambulatory Visit: Payer: Self-pay

## 2019-05-24 VITALS — BP 76/64 | HR 71 | Ht 67.0 in | Wt 154.0 lb

## 2019-05-24 DIAGNOSIS — S92002A Unspecified fracture of left calcaneus, initial encounter for closed fracture: Secondary | ICD-10-CM

## 2019-05-24 NOTE — Progress Notes (Signed)
Office Visit Note   Patient: Gina Black           Date of Birth: [birth year]           MRN: [id number] Visit Date: 05/24/2019              Requested by: Fayrene Helper, [place], [place],  [place] [address] PCP: Fayrene Helper, MD   Assessment & Plan: Visit Diagnoses:  1. Closed nondisplaced fracture of left calcaneus, unspecified portion of calcaneus, initial encounter     Plan: Patient instructed she should be nonweightbearing and has been noncompliant in instructions.  I called [facility] for her and  Her wheelchair is getting delivered this afternoon.  Return 4 weeks with lateral heel and  Harris skiers heel view obtained on review.  Follow-Up Instructions: No follow-ups on file.   Orders:  Orders Placed This Encounter  Procedures  . XR Os Calcis Left   No orders of the defined types were placed in this encounter.     Procedures: No procedures performed   Clinical Data: No additional findings.   Subjective: Chief Complaint  Patient presents with  . Left Foot - Fracture, Follow-up    Fall 04/30/2019    HPI follow-up comminuted calcaneus fracture.  Patient has crutches she is nearly fully weightbearing with short stride step advancing left foot first for about 4 to 6 inches and then repeating.  She states originally she was nonweightbearing.  X-ray demonstrates some loss of position of the calcaneus in her Boehler's angle approaches 0 today.  Patient lives alone call [facility] is a wheelchair with supposed be delivered to her today.  We discussed importance of nonweightbearing to prevent further collapse and progression of subtalar arthritis problems that can develop.  Review of Systems 14 point systems updated unchanged.   Objective: Vital Signs: BP (!) 76/64   Pulse 71   Ht 5\' 7"  (1.702 m)   Wt 154 lb (69.9 kg)   BMI 24.12 kg/m   Physical Exam Constitutional:      Appearance: She is well-developed.    HENT:     Head: Normocephalic.     Right Ear: External ear normal.     Left Ear: External ear normal.  Eyes:     Pupils: Pupils are equal, round, and reactive to light.  Neck:     Thyroid: No thyromegaly.     Trachea: No tracheal deviation.  Cardiovascular:     Rate and Rhythm: Normal rate.  Pulmonary:     Effort: Pulmonary effort is normal.  Abdominal:     Palpations: Abdomen is soft.  Skin:    General: Skin is warm and dry.  Neurological:     Mental Status: She is alert and oriented to person, place, and time.  Psychiatric:        Behavior: Behavior normal.     Ortho Exam patient has cam boot she is wearing appropriately.  Specialty Comments:  No specialty comments available.  Imaging: No results found.   PMFS History: Patient Active Problem List   Diagnosis Date Noted  . Closed nondisplaced fracture of left calcaneus 05/01/2019  . Overweight (BMI 25.0-29.9) 03/18/2018  . Adenomatous polyp of colon 09/25/2016  . Plantar callus 09/25/2016  . Decreased vision 08/22/2015  . Dyslipidemia 07/11/2011  . Psychotic disorder with delusions ([place]) 05/15/2010  . Vitamin D deficiency 02/25/2009  . Essential hypertension 09/05/2007   Past Medical History:  Diagnosis Date  .  Hemorrhoids   . Hyperlipidemia   . Hypertension   . Need for immunization against influenza 11/23/2015  . Psychotic disorder [facility]) August 2009   Hopitalised Involuntary commited by daughter   . S/P colonoscopy 03/20/2008   Dr Oneida Alar normal, except hemorrhoids    Family History  Problem Relation Age of Onset  . Hypertension Mother   . Diabetes Mother   . Kidney disease Mother   . Diabetes Father   . Colon cancer Neg Hx   . Colon polyps Neg Hx     Past Surgical History:  Procedure Laterality Date  . CHOLECYSTECTOMY  2001  . COLONOSCOPY N/A 02/06/2018   Procedure: COLONOSCOPY;  Surgeon: Danie Binder, MD;  [facility];  Service: Endoscopy;  Laterality: N/A;  1:15  . [place]   right   . POLYPECTOMY  02/06/2018   Procedure: POLYPECTOMY;  Surgeon: Danie Binder, MD;  [facility];  Service: Endoscopy;;  ascending colon   Social History   Occupational History  . Occupation: disabled    Comment: previously cook  Tobacco Use  . Smoking status: Former Smoker    Packs/day: 1.00    Years: 6.00    Pack years: 6.00    Types: Cigarettes    Quit date: 04/28/2010    Years since quitting: 9.0  . Smokeless tobacco: Never Used  Substance and Sexual Activity  . Alcohol use: No  . Drug use: No  . Sexual activity: Not Currently

## 2019-06-10 ENCOUNTER — Encounter: Payer: Self-pay | Admitting: Family Medicine

## 2019-06-13 ENCOUNTER — Ambulatory Visit: Payer: Medicare Other | Attending: Internal Medicine

## 2019-06-13 DIAGNOSIS — Z23 Encounter for immunization: Secondary | ICD-10-CM

## 2019-06-13 NOTE — Progress Notes (Signed)
   Covid-19 Vaccination Clinic  Name:  Gina Black    MRN: [id number] DOB: [birth year]  06/13/2019  [name] was observed post Covid-19 immunization for 15 minutes without incident. She was provided with Vaccine Information Sheet and instruction to access the V-Safe system.   [name] was instructed to call 911 with any severe reactions post vaccine: Marland Kitchen Difficulty breathing  . Swelling of face and throat  . A fast heartbeat  . A bad rash all over body  . Dizziness and weakness   Immunizations Administered    Name Date Dose VIS Date Route   Moderna COVID-19 Vaccine 06/13/2019 12:03 PM 0.5 mL 01/2019 Intramuscular   Manufacturer: Moderna   Lot: [id number]   [id number]

## 2019-06-21 ENCOUNTER — Ambulatory Visit (INDEPENDENT_AMBULATORY_CARE_PROVIDER_SITE_OTHER): Payer: Medicare Other | Admitting: Orthopaedic Surgery

## 2019-06-21 ENCOUNTER — Other Ambulatory Visit: Payer: Self-pay

## 2019-06-21 ENCOUNTER — Ambulatory Visit: Payer: Self-pay

## 2019-06-21 ENCOUNTER — Encounter: Payer: Self-pay | Admitting: Orthopaedic Surgery

## 2019-06-21 ENCOUNTER — Encounter: Payer: Self-pay | Admitting: Family Medicine

## 2019-06-21 ENCOUNTER — Ambulatory Visit (INDEPENDENT_AMBULATORY_CARE_PROVIDER_SITE_OTHER): Payer: Medicare Other | Admitting: Family Medicine

## 2019-06-21 VITALS — BP 144/71 | HR 80

## 2019-06-21 DIAGNOSIS — L819 Disorder of pigmentation, unspecified: Secondary | ICD-10-CM | POA: Diagnosis not present

## 2019-06-21 DIAGNOSIS — M25471 Effusion, right ankle: Secondary | ICD-10-CM

## 2019-06-21 DIAGNOSIS — S92002A Unspecified fracture of left calcaneus, initial encounter for closed fracture: Secondary | ICD-10-CM | POA: Diagnosis not present

## 2019-06-21 DIAGNOSIS — M25475 Effusion, left foot: Secondary | ICD-10-CM

## 2019-06-21 DIAGNOSIS — I739 Peripheral vascular disease, unspecified: Secondary | ICD-10-CM

## 2019-06-21 DIAGNOSIS — L84 Corns and callosities: Secondary | ICD-10-CM | POA: Diagnosis not present

## 2019-06-21 DIAGNOSIS — M25472 Effusion, left ankle: Secondary | ICD-10-CM

## 2019-06-21 DIAGNOSIS — M25474 Effusion, right foot: Secondary | ICD-10-CM

## 2019-06-21 NOTE — Patient Instructions (Addendum)
I appreciate the opportunity to provide you with care for your health and wellness. Today we discussed: changes in lower leg and feet  Follow up: 07/31/2019 as scheduled   No labs or referrals today from  Please be safe and take care. Hope you feel better soon and that you do ok coming out of the boot in a month.  Remember the compression will take a while to get use to.   Please continue to practice social distancing to keep you, your family, and our community safe.  If you must go out, please wear a mask and practice good handwashing.  It was a pleasure to see you and I look forward to continuing to work together on your health and well-being. Please do not hesitate to call the office if you need care or have questions about your care.  Have a wonderful day and week. With Gratitude, Cherly Beach, DNP, AGNP-BC

## 2019-06-21 NOTE — Progress Notes (Signed)
Office Visit Note   Patient: Gina Black           Date of Birth: 10/15/1955           MRN: [id number] Visit Date: 06/21/2019              Requested by: Fayrene Helper, [place], [place],  [place] [address] PCP: Fayrene Helper, MD   Assessment & Plan: Visit Diagnoses:  1. Closed nondisplaced fracture of left calcaneus, unspecified portion of calcaneus, initial encounter     Plan: ROV 2 months she can begin using tennis shoes and progress with weightbearing June 22.  On return visit single lateral x-ray of the left calcaneus.  Follow-Up Instructions: No follow-ups on file.   Orders:  Orders Placed This Encounter  Procedures  . XR Foot Complete Left   No orders of the defined types were placed in this encounter.     Procedures: No procedures performed   Clinical Data: No additional findings.   Subjective: Chief Complaint  Patient presents with  . Left Foot - Follow-up    HPI follow-up left calcaneus fracture patient continues to weight-bear despite using her crutches she is appearing to almost full weight-bear in her boot.  She states pain is greater than 50% relief.  Review of Systems unchanged from last office visit as pertains HPI.   Objective: Vital Signs: BP (!) 144/71   Pulse 80   Physical Exam Constitutional:      Appearance: She is well-developed.  HENT:     Head: Normocephalic.     Right Ear: External ear normal.     Left Ear: External ear normal.  Eyes:     Pupils: Pupils are equal, round, and reactive to light.  Neck:     Thyroid: No thyromegaly.     Trachea: No tracheal deviation.  Cardiovascular:     Rate and Rhythm: Normal rate.  Pulmonary:     Effort: Pulmonary effort is normal.  Abdominal:     Palpations: Abdomen is soft.  Skin:    General: Skin is warm and dry.  Neurological:     Mental Status: She is alert and oriented to person, place, and time.  Psychiatric:        Behavior: Behavior normal.      Ortho Exam patient has some mild pitting edema right and left minimal darkening over the pretibial area no areas of active ulceration.  Specialty Comments:  No specialty comments available.  Imaging: No results found.   PMFS History: Patient Active Problem List   Diagnosis Date Noted  . Closed nondisplaced fracture of left calcaneus 05/01/2019  . Overweight (BMI 25.0-29.9) 03/18/2018  . Adenomatous polyp of colon 09/25/2016  . Plantar callus 09/25/2016  . Decreased vision 08/22/2015  . Dyslipidemia 07/11/2011  . Psychotic disorder with delusions ([place]) 05/15/2010  . Vitamin D deficiency 02/25/2009  . Essential hypertension 09/05/2007   Past Medical History:  Diagnosis Date  . Hemorrhoids   . Hyperlipidemia   . Hypertension   . Need for immunization against influenza 11/23/2015  . Psychotic disorder [facility]) August 2009   Hopitalised Involuntary commited by daughter   . S/P colonoscopy 03/20/2008   Dr Oneida Alar normal, except hemorrhoids    Family History  Problem Relation Age of Onset  . Hypertension Mother   . Diabetes Mother   . Kidney disease Mother   . Diabetes Father   . Colon cancer Neg Hx   . Colon polyps  Neg Hx     Past Surgical History:  Procedure Laterality Date  . CHOLECYSTECTOMY  2001  . COLONOSCOPY N/A 02/06/2018   Procedure: COLONOSCOPY;  Surgeon: Danie Binder, MD;  [facility];  Service: Endoscopy;  Laterality: N/A;  1:15  . [place]   right   . POLYPECTOMY  02/06/2018   Procedure: POLYPECTOMY;  Surgeon: Danie Binder, MD;  [facility];  Service: Endoscopy;;  ascending colon   Social History   Occupational History  . Occupation: disabled    Comment: previously cook  Tobacco Use  . Smoking status: Former Smoker    Packs/day: 1.00    Years: 6.00    Pack years: 6.00    Types: Cigarettes    Quit date: 04/28/2010    Years since quitting: 9.1  . Smokeless tobacco: Never Used  Substance and  Sexual Activity  . Alcohol use: No  . Drug use: No  . Sexual activity: Not Currently

## 2019-06-21 NOTE — Progress Notes (Signed)
Subjective:  Patient ID: Gina Black, female    DOB: [birth year]  Age: 64 y.o. MRN: [id number]  CC: No chief complaint on file.     HPI  HPI  Gina Black is a 64 year old female patient of Dr. Griffin Dakin. She is in a boot secondary to a nondisplaced calcaneus fracture, after falling off the bed trying to change a light bulb..  She presents today with a cousin family member.  As her daughter was unable to be present due to work.  Though her daughter is on the phone so that she can talk to provider and provide a little bit of history history secondary to the nature of Gina Black memory.  Today Gina Black has ankles that are discolored, they were discolored prior to having her fracture.  She reports no pain, numbness or tingling.  She reports that she hurt her back years ago.  There has been some changes in her vascular system secondary to standing for long periods of time when she is to work.  She does have some leg swelling per daughter.  Bilateral ankles.  Injury right now prohibits me from observing the left foot and ankle.  She denies that they are cold but her family reports that she does report that they are cold to them.   She is never been told that she has peripheral vascular disease.  Today patient denies signs and symptoms of COVID 19 infection including fever, chills, cough, shortness of breath, and headache. Past Medical, Surgical, Social History, Allergies, and Medications have been Reviewed.   Past Medical History:  Diagnosis Date  . Adenomatous polyp of colon 09/25/2016   Dx in 01/2018 needs rept colonoscopy in 01/2023 per Dr Oneida Alar  . Decreased vision 08/22/2015  . Hemorrhoids   . Hyperlipidemia   . Hypertension   . Need for immunization against influenza 11/23/2015  . Psychotic disorder [facility]) August 2009   Hopitalised Involuntary commited by daughter   . S/P colonoscopy 03/20/2008   Dr Oneida Alar normal, except hemorrhoids    No outpatient medications  have been marked as taking for the 06/21/19 encounter (Office Visit) with Perlie Mayo, NP.    ROS:  Review of Systems  Constitutional: Negative.   HENT: Negative.   Eyes: Negative.   Respiratory: Negative.   Cardiovascular: Negative.   Gastrointestinal: Negative.   Genitourinary: Negative.   Musculoskeletal: Negative.        See HPI  Skin: Negative.   Neurological: Negative.   Endo/Heme/Allergies: Negative.   Psychiatric/Behavioral: Negative.   All other systems reviewed and are negative.    Objective:   Today's Vitals: There were no vitals taken for this visit. Vitals with BMI 06/21/2019 05/24/2019 05/09/2019  Height - 5\' 7"  5\' 7"   Weight - 154 lbs 154 lbs  BMI - 123456 123456  Systolic 123456 76 -  Diastolic 71 64 -  Pulse 80 71 -     Physical Exam Vitals and nursing note reviewed.  Constitutional:      Appearance: Normal appearance. She is well-developed, well-groomed and overweight.  HENT:     Head: Normocephalic and atraumatic.     Right Ear: External ear normal.     Left Ear: External ear normal.     Mouth/Throat:     Comments: Mask in place Eyes:     General:        Right eye: No discharge.        Left eye: No discharge.     Conjunctiva/sclera:  Conjunctivae normal.  Cardiovascular:     Rate and Rhythm: Normal rate and regular rhythm.     Pulses:          Posterior tibial pulses are 1+ on the right side.     Heart sounds: Normal heart sounds.  Pulmonary:     Effort: Pulmonary effort is normal.     Breath sounds: Normal breath sounds.  Musculoskeletal:     Cervical back: Normal range of motion and neck supple.  Feet:     Right foot:     Skin integrity: Callus present.     Comments: Noted skin changes at the ankles minimal darkening Callused toes  Left foot in boot Skin:    General: Skin is warm.  Neurological:     General: No focal deficit present.     Mental Status: She is alert and oriented to person, place, and time.  Psychiatric:        Mood  and Affect: Mood normal.        Behavior: Behavior normal.        Thought Content: Thought content normal.        Judgment: Judgment normal.     Depression screen [facility] 2/9 06/21/2019 01/12/2019 10/26/2018  Decreased Interest 0 0 0  Down, Depressed, Hopeless 0 0 0  PHQ - 2 Score 0 0 0     Assessment   1. Peripheral vascular disease ([place])   2. Closed nondisplaced fracture of left calcaneus, unspecified portion of calcaneus, initial encounter   3. Bilateral swelling of feet and ankles   4. Discoloration of skin of lower leg   5. Corns and callus     Tests ordered No orders of the defined types were placed in this encounter.   Plan: Please see assessment and plan per problem list above.   No orders of the defined types were placed in this encounter.   Patient to follow-up in 07/31/2019   Perlie Mayo, NP

## 2019-06-23 DIAGNOSIS — S92002A Unspecified fracture of left calcaneus, initial encounter for closed fracture: Secondary | ICD-10-CM | POA: Diagnosis not present

## 2019-06-28 ENCOUNTER — Encounter: Payer: Self-pay | Admitting: Family Medicine

## 2019-06-28 DIAGNOSIS — M25475 Effusion, left foot: Secondary | ICD-10-CM | POA: Insufficient documentation

## 2019-06-28 DIAGNOSIS — L84 Corns and callosities: Secondary | ICD-10-CM | POA: Insufficient documentation

## 2019-06-28 DIAGNOSIS — L819 Disorder of pigmentation, unspecified: Secondary | ICD-10-CM | POA: Insufficient documentation

## 2019-06-28 DIAGNOSIS — M25472 Effusion, left ankle: Secondary | ICD-10-CM | POA: Insufficient documentation

## 2019-06-28 NOTE — Assessment & Plan Note (Signed)
Noted to have skin changes and swelling in lower leg, most notably could be attributed to PVD. Will consider work up if compression socks do not help with care.

## 2019-06-28 NOTE — Assessment & Plan Note (Deleted)
Noted to have skin changes and swelling in lower leg, most notably could be attributed to PVD. Will consider work up if compression socks do not help with care.

## 2019-06-28 NOTE — Assessment & Plan Note (Signed)
She is stable, in boot still. Predicted to come out in June.

## 2019-06-28 NOTE — Assessment & Plan Note (Signed)
Encouraged new shoes with cushion and mole skin or corn pads

## 2019-06-28 NOTE — Assessment & Plan Note (Signed)
Could be related to PVD and or dependence, encouraged compression socks. Will follow up within a month to see if improved.

## 2019-07-24 DIAGNOSIS — S92002A Unspecified fracture of left calcaneus, initial encounter for closed fracture: Secondary | ICD-10-CM | POA: Diagnosis not present

## 2019-07-31 ENCOUNTER — Ambulatory Visit (INDEPENDENT_AMBULATORY_CARE_PROVIDER_SITE_OTHER): Payer: Medicare Other | Admitting: Family Medicine

## 2019-07-31 ENCOUNTER — Telehealth: Payer: Self-pay | Admitting: Orthopedic Surgery

## 2019-07-31 ENCOUNTER — Other Ambulatory Visit: Payer: Self-pay

## 2019-07-31 ENCOUNTER — Encounter: Payer: Self-pay | Admitting: Family Medicine

## 2019-07-31 DIAGNOSIS — I10 Essential (primary) hypertension: Secondary | ICD-10-CM | POA: Diagnosis not present

## 2019-07-31 DIAGNOSIS — Z1231 Encounter for screening mammogram for malignant neoplasm of breast: Secondary | ICD-10-CM | POA: Diagnosis not present

## 2019-07-31 DIAGNOSIS — E663 Overweight: Secondary | ICD-10-CM | POA: Diagnosis not present

## 2019-07-31 DIAGNOSIS — Z0001 Encounter for general adult medical examination with abnormal findings: Secondary | ICD-10-CM | POA: Insufficient documentation

## 2019-07-31 DIAGNOSIS — L819 Disorder of pigmentation, unspecified: Secondary | ICD-10-CM | POA: Diagnosis not present

## 2019-07-31 NOTE — Assessment & Plan Note (Signed)
Gina Black is encouraged to maintain a well balanced diet that is low in salt. Controlled, continue current medication regimen Additionally, she is also reminded that exercise is beneficial for heart health and control of  Blood pressure. 30-60 minutes daily is recommended-walking was suggested as tolerable secondary to being in a boot

## 2019-07-31 NOTE — Assessment & Plan Note (Addendum)
Still noted to have skin changes on lower leg.  Most notably could be attributed to PVD.  Consider work-up if compression socks did not help.  She reports that she is not been wearing compression socks like she should.  She is still in the boot with her nondisplaced left ankle fracture.  Once she is out of the boot we will look at doing a referral to [facility].

## 2019-07-31 NOTE — Telephone Encounter (Signed)
Patient called asked if her daughter could be called and informed that Dr Sharol Given did order the wheelchair for her on  05/25/2019. The number to contact Joseph Art is [phone]

## 2019-07-31 NOTE — Progress Notes (Signed)
Subjective:  Patient ID: Gina Black, female    DOB: October 14, 1955  Age: 64 y.o. MRN: [id number]  CC:  Chief Complaint  Patient presents with  . Follow-up    3 month follow up bp everything is going well      HPI  HPI  [name] is a 64 year old female patient Dr. Griffin Black.  She is here today for her 70-month follow-up.  She reports that she is doing well.  She denies having any sleep difficulties.  She denies having any trouble chewing swallowing or eating difficulties.  She denies having any changes in her bowel or bladder habits.  She denies have any changes in her memory or mood.  She denies having any recent falls outside of the father for broke her ankle.  She denies having any skin issues outside of the skin issues that we discussed at her last visit a few weeks ago secondary to possible vascular changes.  Of note she is due for mammogram.  And is okay for [place] to set this up today.  She denies having any chest pain, shortness of breath, palpitations, leg swelling, headaches, dizziness or vision changes.  Today patient denies signs and symptoms of COVID 19 infection including fever, chills, cough, shortness of breath, and headache. Past Medical, Surgical, Social History, Allergies, and Medications have been Reviewed.   Past Medical History:  Diagnosis Date  . Adenomatous polyp of colon 09/25/2016   Dx in 01/2018 needs rept colonoscopy in 01/2023 per Dr Gina Black  . Decreased vision 08/22/2015  . Hemorrhoids   . Hyperlipidemia   . Hypertension   . Need for immunization against influenza 11/23/2015  . Plantar callus 09/25/2016  . Psychotic disorder [facility]) August 2009   Hopitalised Involuntary commited by daughter   . S/P colonoscopy 03/20/2008   Dr Gina Black normal, except hemorrhoids    Current Meds  Medication Sig  . Ascorbic Acid (VITAMIN C PO) Take 1 tablet by mouth daily.  . Calcium Carbonate-Vitamin D (CALCIUM 600+D PO) Take 1 tablet by mouth daily.  .  ciclopirox (PENLAC) 8 % solution APPLY TO THE AFFECTED AREA ONCE DAILY.  . Multiple Vitamin (MULTIVITAMIN) tablet Take 1 tablet by mouth daily.  Marland Kitchen oxyCODONE-acetaminophen (PERCOCET) 5-325 MG tablet Take 1 tablet by mouth every 4 (four) hours as needed.  . potassium chloride (KLOR-CON) 10 MEQ tablet TAKE ONE TABLET BY MOUTH 2 TIMES A DAY.  Marland Kitchen triamterene-hydrochlorothiazide (MAXZIDE-25) 37.5-25 MG tablet Take 1 tablet by mouth daily.    ROS:  Review of Systems  Constitutional: Negative.   HENT: Negative.   Eyes: Negative.   Respiratory: Negative.   Cardiovascular: Negative.   Gastrointestinal: Negative.   Genitourinary: Negative.   Musculoskeletal: Negative.   Skin: Negative.   Neurological: Negative.   Endo/Heme/Allergies: Negative.   Psychiatric/Behavioral: Negative.   All other systems reviewed and are negative.    Objective:   Today's Vitals: BP 138/84 (BP Location: Left Arm, Patient Position: Sitting, Cuff Size: Normal)   Pulse 73   Temp (!) 97.4 F (36.3 C) (Temporal)   Ht 5\' 7"  (1.702 m)   Wt 184 lb (83.5 kg) Comment: had boot on foot bc broken  SpO2 99%   BMI 28.82 kg/m  Vitals with BMI 07/31/2019 06/21/2019 05/24/2019  Height 5\' 7"  - 5\' 7"   Weight 184 lbs - 154 lbs  BMI 62.22 - 97.98  Systolic 921 194 76  Diastolic 84 71 64  Pulse 73 80 71     Physical Exam  Vitals and nursing note reviewed.  Constitutional:      Appearance: Normal appearance. She is well-developed, well-groomed and overweight.  HENT:     Head: Normocephalic and atraumatic.     Right Ear: External ear normal.     Left Ear: External ear normal.     Mouth/Throat:     Comments: Mask in place  Eyes:     General:        Right eye: No discharge.        Left eye: No discharge.     Conjunctiva/sclera: Conjunctivae normal.  Cardiovascular:     Rate and Rhythm: Normal rate and regular rhythm.     Pulses: Normal pulses.     Heart sounds: Normal heart sounds.  Pulmonary:     Effort: Pulmonary  effort is normal.     Breath sounds: Normal breath sounds.  Musculoskeletal:     Cervical back: Normal range of motion and neck supple.     Comments: Still presenting with boot on left ankle.  Skin:    General: Skin is warm.  Neurological:     General: No focal deficit present.     Mental Status: She is alert and oriented to person, place, and time.  Psychiatric:        Attention and Perception: Attention normal.        Mood and Affect: Mood normal.        Speech: Speech normal.        Behavior: Behavior normal.        Thought Content: Thought content normal.        Judgment: Judgment normal.     Comments: Pleasant good communication.      Assessment   1. Essential hypertension   2. Discoloration of skin of lower leg   3. Overweight (BMI 25.0-29.9)   4. Visit for screening mammogram     Tests ordered Orders Placed This Encounter  Procedures  . MM 3D SCREEN BREAST BILATERAL     Plan: Please see assessment and plan per problem list above.   No orders of the defined types were placed in this encounter.   Patient to follow-up in 10/31/2019   Gina Mayo, NP

## 2019-07-31 NOTE — Patient Instructions (Addendum)
I appreciate the opportunity to provide you with care for your health and wellness. Today we discussed: overall health   Follow up: 3 months   No labs or referrals today  When you are out of the boot call the office and we will look at referral to a vein specialist to check for good blood flow.  Please continue to practice social distancing to keep you, your family, and our community safe.  If you must go out, please wear a mask and practice good handwashing.  It was a pleasure to see you and I look forward to continuing to work together on your health and well-being. Please do not hesitate to call the office if you need care or have questions about your care.  Have a wonderful day and week. With Gratitude, Cherly Beach, DNP, AGNP-BC

## 2019-07-31 NOTE — Assessment & Plan Note (Signed)
Needs updated mammogram ordered today.

## 2019-07-31 NOTE — Assessment & Plan Note (Signed)
Deteriorated,  Gina Black is re-educated about the importance of exercise daily to help with weight management. A minumum of 30 minutes daily is recommended. Additionally, importance of healthy food choices  with portion control discussed. .  Wt Readings from Last 3 Encounters:  07/31/19 184 lb (83.5 kg)  05/24/19 154 lb (69.9 kg)  05/09/19 154 lb (69.9 kg)

## 2019-08-01 NOTE — Telephone Encounter (Signed)
I called and lm on vm to advise pt that she has not been in the office since the end of march we do need follow up in the office. A wheelchair was ordered in April through [facility] and the pt declined this order. Pt wanted to have this ordered through [employer] and this was faxed twice in the month of April. To call and make an appt for follow up and call with any questions.

## 2019-08-01 NOTE — Telephone Encounter (Signed)
To call

## 2019-08-07 ENCOUNTER — Other Ambulatory Visit: Payer: Self-pay

## 2019-08-07 ENCOUNTER — Ambulatory Visit: Payer: Self-pay

## 2019-08-07 ENCOUNTER — Encounter: Payer: Self-pay | Admitting: Orthopaedic Surgery

## 2019-08-07 ENCOUNTER — Ambulatory Visit: Payer: Medicare Other | Admitting: Orthopaedic Surgery

## 2019-08-07 VITALS — Ht 67.0 in | Wt 184.0 lb

## 2019-08-07 DIAGNOSIS — S92002A Unspecified fracture of left calcaneus, initial encounter for closed fracture: Secondary | ICD-10-CM | POA: Diagnosis not present

## 2019-08-07 NOTE — Progress Notes (Signed)
Office Visit Note   Patient: Gina Black           Date of Birth: 08/12/1955           MRN: [id number] Visit Date: 08/07/2019              Requested by: Fayrene Helper, [place], [place],  [place] [id number] PCP: Fayrene Helper, MD   Assessment & Plan: Visit Diagnoses:  1. Closed nondisplaced fracture of left calcaneus, unspecified portion of calcaneus, initial encounter   2. Noncompliance   Plan: Patient is continue cam boot she needs to be nonweightbearing she has a wheelchair at home but has not been using it.  Her cousin is with her and states that when she gets up she has been weightbearing as tolerated we reviewed previous x-rays showing a nondisplaced comminuted fracture by CT scan and then again today's x-rays which show displacement.  Fracture is not healed and we discussed the importance of following nonweightbearing instructions.  Return 1 month for 2 view x-rays left heel.  Follow-Up Instructions: Return in about 1 month (around 09/06/2019).   Orders:  Orders Placed This Encounter  Procedures  . XR Os Calcis Left   No orders of the defined types were placed in this encounter.     Procedures: No procedures performed   Clinical Data: No additional findings.   Subjective: Chief Complaint  Patient presents with  . Left Foot - Fracture    HPI 64 year old female returns with comminuted calcaneus fracture which is essentially nondisplaced.  She has both been nonweightbearing and returns with her cam boot with significant wear and has been using crutches but significantly weightbearing.  Her foot is swollen tender skin is intact.  She has some able get a shoe on.  Her daughter told her she should be wearing a shoe.  Review of Systems   Objective: Vital Signs: Ht 5\' 7"  (1.702 m)   Wt 184 lb (83.5 kg)   BMI 28.82 kg/m   Physical Exam Constitutional:      Appearance: She is well-developed.  HENT:     Head: Normocephalic.      Right Ear: External ear normal.     Left Ear: External ear normal.  Eyes:     Pupils: Pupils are equal, round, and reactive to light.  Neck:     Thyroid: No thyromegaly.     Trachea: No tracheal deviation.  Cardiovascular:     Rate and Rhythm: Normal rate.  Pulmonary:     Effort: Pulmonary effort is normal.  Abdominal:     Palpations: Abdomen is soft.  Skin:    General: Skin is warm and dry.  Neurological:     Mental Status: She is alert and oriented to person, place, and time.  Psychiatric:        Behavior: Behavior normal.     Ortho Exam patient is some discoloration of her foot and ankle good capillary refill.  There is significant tenderness along the calcaneus posteriorly medial and lateral with loss of length of the calcaneus comparison opposite foot.  Specialty Comments:  No specialty comments available.  Imaging: No results found.   PMFS History: Patient Active Problem List   Diagnosis Date Noted  . Visit for screening mammogram 07/31/2019  . Bilateral swelling of feet and ankles 06/28/2019  . Discoloration of skin of lower leg 06/28/2019  . Corns and callus 06/28/2019  . Closed nondisplaced fracture of left calcaneus 05/01/2019  .  Overweight (BMI 25.0-29.9) 03/18/2018  . Dyslipidemia 07/11/2011  . Psychotic disorder with delusions ([place]) 05/15/2010  . Vitamin D deficiency 02/25/2009  . Essential hypertension 09/05/2007   Past Medical History:  Diagnosis Date  . Adenomatous polyp of colon 09/25/2016   Dx in 01/2018 needs rept colonoscopy in 01/2023 per Dr Oneida Alar  . Decreased vision 08/22/2015  . Hemorrhoids   . Hyperlipidemia   . Hypertension   . Need for immunization against influenza 11/23/2015  . Plantar callus 09/25/2016  . Psychotic disorder [facility]) August 2009   Hopitalised Involuntary commited by daughter   . S/P colonoscopy 03/20/2008   Dr Oneida Alar normal, except hemorrhoids    Family History  Problem Relation Age of Onset  . Hypertension Mother     . Diabetes Mother   . Kidney disease Mother   . Diabetes Father   . Colon cancer Neg Hx   . Colon polyps Neg Hx     Past Surgical History:  Procedure Laterality Date  . CHOLECYSTECTOMY  2001  . COLONOSCOPY N/A 02/06/2018   Procedure: COLONOSCOPY;  Surgeon: Danie Binder, MD;  [facility];  Service: Endoscopy;  Laterality: N/A;  1:15  . [place]   right   . POLYPECTOMY  02/06/2018   Procedure: POLYPECTOMY;  Surgeon: Danie Binder, MD;  Location[facility];  Service: Endoscopy;;  ascending colon   Social History   Occupational History  . Occupation: disabled    Comment: previously cook  Tobacco Use  . Smoking status: Former Smoker    Packs/day: 1.00    Years: 6.00    Pack years: 6.00    Types: Cigarettes    Quit date: 04/28/2010    Years since quitting: 9.2  . Smokeless tobacco: Never Used  Vaping Use  . Vaping Use: Never used  Substance and Sexual Activity  . Alcohol use: No  . Drug use: No  . Sexual activity: Not Currently

## 2019-08-15 ENCOUNTER — Telehealth: Payer: Self-pay | Admitting: Orthopaedic Surgery

## 2019-08-15 NOTE — Telephone Encounter (Signed)
Patients daughter Mikle Bosworth, called. States patient has appt. with another doctor tomorrow and needs to pick up xrays. Please call when ready. [phone]. Patient is to be signing release.

## 2019-08-15 NOTE — Telephone Encounter (Signed)
Patient's daughter aware that CD is ready for pickup at front desk.

## 2019-08-16 ENCOUNTER — Other Ambulatory Visit: Payer: Self-pay | Admitting: Orthopaedic Surgery

## 2019-08-16 DIAGNOSIS — M25572 Pain in left ankle and joints of left foot: Secondary | ICD-10-CM | POA: Diagnosis not present

## 2019-08-16 DIAGNOSIS — S99002A Unspecified physeal fracture of left calcaneus, initial encounter for closed fracture: Secondary | ICD-10-CM

## 2019-08-21 ENCOUNTER — Other Ambulatory Visit (HOSPITAL_COMMUNITY): Payer: Self-pay | Admitting: Family Medicine

## 2019-08-21 DIAGNOSIS — Z1231 Encounter for screening mammogram for malignant neoplasm of breast: Secondary | ICD-10-CM

## 2019-08-23 ENCOUNTER — Ambulatory Visit: Payer: Medicare Other | Admitting: Orthopaedic Surgery

## 2019-08-23 DIAGNOSIS — S92002A Unspecified fracture of left calcaneus, initial encounter for closed fracture: Secondary | ICD-10-CM | POA: Diagnosis not present

## 2019-08-31 ENCOUNTER — Ambulatory Visit
Admission: RE | Admit: 2019-08-31 | Discharge: 2019-08-31 | Disposition: A | Discharge status: Home / Self Care | Payer: Medicare Other | Source: Ambulatory Visit | Attending: Orthopaedic Surgery | Admitting: Orthopaedic Surgery

## 2019-08-31 DIAGNOSIS — M7989 Other specified soft tissue disorders: Secondary | ICD-10-CM | POA: Diagnosis not present

## 2019-08-31 DIAGNOSIS — M439 Deforming dorsopathy, unspecified: Secondary | ICD-10-CM | POA: Diagnosis not present

## 2019-08-31 DIAGNOSIS — M6258 Muscle wasting and atrophy, not elsewhere classified, other site: Secondary | ICD-10-CM | POA: Diagnosis not present

## 2019-08-31 DIAGNOSIS — R600 Localized edema: Secondary | ICD-10-CM | POA: Diagnosis not present

## 2019-08-31 DIAGNOSIS — S99002A Unspecified physeal fracture of left calcaneus, initial encounter for closed fracture: Secondary | ICD-10-CM

## 2019-09-05 DIAGNOSIS — M19072 Primary osteoarthritis, left ankle and foot: Secondary | ICD-10-CM | POA: Diagnosis not present

## 2019-09-05 DIAGNOSIS — S92002A Unspecified fracture of left calcaneus, initial encounter for closed fracture: Secondary | ICD-10-CM | POA: Diagnosis not present

## 2019-09-20 DIAGNOSIS — S92002A Unspecified fracture of left calcaneus, initial encounter for closed fracture: Secondary | ICD-10-CM | POA: Diagnosis not present

## 2019-09-20 DIAGNOSIS — M8580 Other specified disorders of bone density and structure, unspecified site: Secondary | ICD-10-CM | POA: Diagnosis not present

## 2019-09-23 DIAGNOSIS — S92002A Unspecified fracture of left calcaneus, initial encounter for closed fracture: Secondary | ICD-10-CM | POA: Diagnosis not present

## 2019-10-24 DIAGNOSIS — S92002A Unspecified fracture of left calcaneus, initial encounter for closed fracture: Secondary | ICD-10-CM | POA: Diagnosis not present

## 2019-10-31 ENCOUNTER — Ambulatory Visit: Payer: Medicare Other | Admitting: Family Medicine

## 2019-11-13 ENCOUNTER — Encounter: Payer: Self-pay | Admitting: Family Medicine

## 2019-11-13 ENCOUNTER — Ambulatory Visit (INDEPENDENT_AMBULATORY_CARE_PROVIDER_SITE_OTHER): Payer: Medicare Other | Admitting: Family Medicine

## 2019-11-13 ENCOUNTER — Other Ambulatory Visit: Payer: Self-pay

## 2019-11-13 VITALS — BP 144/82 | HR 68 | Resp 16 | Ht 67.0 in | Wt 177.0 lb

## 2019-11-13 DIAGNOSIS — Z23 Encounter for immunization: Secondary | ICD-10-CM | POA: Diagnosis not present

## 2019-11-13 DIAGNOSIS — I10 Essential (primary) hypertension: Secondary | ICD-10-CM | POA: Diagnosis not present

## 2019-11-13 DIAGNOSIS — S92002A Unspecified fracture of left calcaneus, initial encounter for closed fracture: Secondary | ICD-10-CM | POA: Diagnosis not present

## 2019-11-13 NOTE — Patient Instructions (Addendum)
I appreciate the opportunity to provide you with care for your health and wellness. Today we discussed: BP    Follow up: 4 months   No labs or referrals today Flu vaccine today  Please take your medication once you get home. Try to take first thing in the morning.  Glad to see you are moving around better! Hope you are off the crutches soon!   Please continue to practice social distancing to keep you, your family, and our community safe.  If you must go out, please wear a mask and practice good handwashing.  It was a pleasure to see you and I look forward to continuing to work together on your health and well-being. Please do not hesitate to call the office if you need care or have questions about your care.  Have a wonderful day and week. With Gratitude, Cherly Beach, DNP, AGNP-BC

## 2019-11-13 NOTE — Assessment & Plan Note (Signed)
Gina Black is encouraged to maintain a well balanced diet that is low in salt. Did not take medication today yet Has been controlled, continue current medication regimen.  Additionally, she is also reminded that exercise is beneficial for heart health and control of  Blood pressure. 30-60 minutes daily is recommended-walking was suggested.

## 2019-11-13 NOTE — Assessment & Plan Note (Signed)

## 2019-11-13 NOTE — Progress Notes (Signed)
Subjective:  Patient ID: Gina Black, female    DOB: September 05, 1955  Age: 64 y.o. MRN: [id number]  CC:  Chief Complaint  Patient presents with  . Hypertension    follow up      HPI  HPI   Ms Gina Black is a 64 year old female patient of Dr Simpson's  She overall is doing well and is here for a 3 months follow up. History as detailed below.  She reports overall doing well.  Denies having any headaches, chest pain, dizziness, vision changes, leg swelling, palpitations.  Reports she is not taking her blood pressure medicine today and that might be why it is just a little bit elevated.  She denies any changes in sleep habits.  Denies having any changes in eating habits.  No changes in bowel or bladder habits.  Overall is doing well and does not have any issues or concerns today to discuss.  Today patient denies signs and symptoms of COVID 19 infection including fever, chills, cough, shortness of breath, and headache. Past Medical, Surgical, Social History, Allergies, and Medications have been Reviewed.   Past Medical History:  Diagnosis Date  . Adenomatous polyp of colon 09/25/2016   Dx in 01/2018 needs rept colonoscopy in 01/2023 per Dr Oneida Alar  . Decreased vision 08/22/2015  . Hemorrhoids   . Hyperlipidemia   . Hypertension   . Need for immunization against influenza 11/23/2015  . Plantar callus 09/25/2016  . Psychotic disorder [facility]) August 2009   Hopitalised Involuntary commited by daughter   . S/P colonoscopy 03/20/2008   Dr Oneida Alar normal, except hemorrhoids    Current Meds  Medication Sig  . Ascorbic Acid (VITAMIN C PO) Take 1 tablet by mouth daily.  . Calcium Carbonate-Vitamin D (CALCIUM 600+D PO) Take 1 tablet by mouth daily.  . ciclopirox (PENLAC) 8 % solution APPLY TO THE AFFECTED AREA ONCE DAILY.  . Multiple Vitamin (MULTIVITAMIN) tablet Take 1 tablet by mouth daily.  . potassium chloride (KLOR-CON) 10 MEQ tablet TAKE ONE TABLET BY MOUTH 2 TIMES A DAY.  Marland Kitchen  triamterene-hydrochlorothiazide (MAXZIDE-25) 37.5-25 MG tablet Take 1 tablet by mouth daily.    ROS:  Review of Systems  Constitutional: Negative.   HENT: Negative.   Eyes: Negative.   Respiratory: Negative.   Cardiovascular: Negative.   Gastrointestinal: Negative.   Genitourinary: Negative.   Musculoskeletal: Negative.   Skin: Negative.   Neurological: Negative.   Endo/Heme/Allergies: Negative.   Psychiatric/Behavioral: Negative.      Objective:   Today's Vitals: BP (!) 144/82   Pulse 68   Resp 16   Ht 5\' 7"  (1.702 m)   Wt 177 lb 0.6 oz (80.3 kg)   SpO2 99%   BMI 27.73 kg/m  Vitals with BMI 11/13/2019 08/07/2019 07/31/2019  Height 5\' 7"  5\' 7"  5\' 7"   Weight 177 lbs 1 oz 184 lbs 184 lbs  BMI 27.72 50.09 38.18  Systolic 299 - 371  Diastolic 82 - 84  Pulse 68 - 73     Physical Exam Vitals and nursing note reviewed.  Constitutional:      Appearance: Normal appearance. She is well-developed, well-groomed and overweight.  HENT:     Head: Normocephalic and atraumatic.     Right Ear: External ear normal.     Left Ear: External ear normal.     Mouth/Throat:     Comments: Mask in place  Eyes:     General:        Right eye: No  discharge.        Left eye: No discharge.     Conjunctiva/sclera: Conjunctivae normal.  Cardiovascular:     Rate and Rhythm: Normal rate and regular rhythm.     Pulses: Normal pulses.     Heart sounds: Normal heart sounds.  Pulmonary:     Effort: Pulmonary effort is normal.     Breath sounds: Normal breath sounds.  Musculoskeletal:        General: Normal range of motion.     Cervical back: Normal range of motion and neck supple.     Comments: In left foot brace and on crutches   Skin:    General: Skin is warm.  Neurological:     General: No focal deficit present.     Mental Status: She is alert and oriented to person, place, and time.  Psychiatric:        Attention and Perception: Attention normal.        Mood and Affect: Mood normal.         Speech: Speech normal.        Behavior: Behavior normal. Behavior is cooperative.        Thought Content: Thought content normal.        Cognition and Memory: Cognition normal.        Judgment: Judgment normal.     Assessment   1. Essential hypertension   2. Closed nondisplaced fracture of left calcaneus, unspecified portion of calcaneus, initial encounter   3. Need for immunization against influenza     Tests ordered Orders Placed This Encounter  Procedures  . Flu Vaccine QUAD High Dose(Fluad)     Plan: Please see assessment and plan per problem list above.   No orders of the defined types were placed in this encounter.   Patient to follow-up in 01/15/2020  Perlie Mayo, NP

## 2019-11-13 NOTE — Assessment & Plan Note (Signed)
She is in a left foot brace and on crutches still. Has upcoming appt, overall reports doing well with this.

## 2019-12-01 ENCOUNTER — Other Ambulatory Visit: Payer: Self-pay | Admitting: Family Medicine

## 2020-01-15 ENCOUNTER — Encounter: Payer: Self-pay | Admitting: Family Medicine

## 2020-01-15 ENCOUNTER — Ambulatory Visit (INDEPENDENT_AMBULATORY_CARE_PROVIDER_SITE_OTHER): Payer: Medicare Other | Admitting: Family Medicine

## 2020-01-15 ENCOUNTER — Other Ambulatory Visit: Payer: Self-pay

## 2020-01-15 VITALS — BP 142/80 | HR 90 | Temp 97.0°F | Ht 67.0 in | Wt 170.0 lb

## 2020-01-15 DIAGNOSIS — Z1231 Encounter for screening mammogram for malignant neoplasm of breast: Secondary | ICD-10-CM

## 2020-01-15 DIAGNOSIS — Z13820 Encounter for screening for osteoporosis: Secondary | ICD-10-CM

## 2020-01-15 DIAGNOSIS — Z0000 Encounter for general adult medical examination without abnormal findings: Secondary | ICD-10-CM

## 2020-01-15 NOTE — Progress Notes (Signed)
Subjective:   Gina Black is a 64 y.o. female who presents for Medicare Annual (Subsequent) preventive examination.  Review of Systems Cardiac Risk Factors include: advanced age (>55men, >106 women)    Objective:    Today's Vitals   01/15/20 1036 01/15/20 1038  BP: (!) 142/80   Pulse: 90   Temp: (!) 97 F (36.1 C)   TempSrc: Temporal   SpO2: 100%   Weight: 170 lb (77.1 kg)   Height: 5\' 7"  (1.702 m)   PainSc: 0-No pain 0-No pain   Body mass index is 26.63 kg/m.  Advanced Directives 04/30/2019 02/06/2018 01/03/2018 12/20/2016  Does Patient Have a Medical Advance Directive? No No No No  Would patient like information on creating a medical advance directive? - No - Patient declined Yes (ED - Information included in AVS) Yes (MAU/Ambulatory/Procedural Areas - Information given)    Current Medications (verified) Outpatient Encounter Medications as of 01/15/2020  Medication Sig  . Ascorbic Acid (VITAMIN C PO) Take 1 tablet by mouth daily.  . Calcium Carbonate-Vitamin D (CALCIUM 600+D PO) Take 1 tablet by mouth daily.  . ciclopirox (PENLAC) 8 % solution APPLY TO THE AFFECTED AREA ONCE DAILY.  . Multiple Vitamin (MULTIVITAMIN) tablet Take 1 tablet by mouth daily.  . potassium chloride (KLOR-CON) 10 MEQ tablet TAKE ONE TABLET BY MOUTH 2 TIMES A DAY.  Marland Kitchen triamterene-hydrochlorothiazide (MAXZIDE-25) 37.5-25 MG tablet TAKE ONE TABLET BY MOUTH DAILY.   No facility-administered encounter medications on file as of 01/15/2020.    Allergies (verified) Ciprofloxacin and Prednisone   History: Past Medical History:  Diagnosis Date  . Adenomatous polyp of colon 09/25/2016   Dx in 01/2018 needs rept colonoscopy in 01/2023 per Dr Oneida Alar  . Decreased vision 08/22/2015  . Hemorrhoids   . Hyperlipidemia   . Hypertension   . Need for immunization against influenza 11/23/2015  . Plantar callus 09/25/2016  . Psychotic disorder [facility]) August 2009   Hopitalised Involuntary commited by  daughter   . S/P colonoscopy 03/20/2008   Dr Oneida Alar normal, except hemorrhoids   Past Surgical History:  Procedure Laterality Date  . CHOLECYSTECTOMY  2001  . COLONOSCOPY N/A 02/06/2018   Procedure: COLONOSCOPY;  Surgeon: Danie Binder, MD;  [facility];  Service: Endoscopy;  Laterality: N/A;  1:15  . [place]   right   . POLYPECTOMY  02/06/2018   Procedure: POLYPECTOMY;  Surgeon: Danie Binder, MD;  [facility];  Service: Endoscopy;;  ascending colon   Family History  Problem Relation Age of Onset  . Hypertension Mother   . Diabetes Mother   . Kidney disease Mother   . Diabetes Father   . Colon cancer Neg Hx   . Colon polyps Neg Hx    Social History   Socioeconomic History  . Marital status: Widowed    Spouse name: Not on file  . Number of children: 1  . Years of education: 27  . Highest education level: 12th grade  Occupational History  . Occupation: disabled    Comment: previously cook  Tobacco Use  . Smoking status: Former Smoker    Packs/day: 1.00    Years: 6.00    Pack years: 6.00    Types: Cigarettes    Quit date: 04/28/2010    Years since quitting: 9.7  . Smokeless tobacco: Never Used  Vaping Use  . Vaping Use: Never used  Substance and Sexual Activity  . Alcohol use: No  .  Drug use: No  . Sexual activity: Not Currently  Other Topics Concern  . Not on file  Social History Narrative   Lives alone.  Daughter in [place].   Social Determinants of Health   Financial Resource Strain: Low Risk   . Difficulty of Paying Living Expenses: Not hard at all  Food Insecurity: No Food Insecurity  . Worried About Charity fundraiser in the Last Year: Never true  . Ran Out of Food in the Last Year: Never true  Transportation Needs: No Transportation Needs  . Lack of Transportation (Medical): No  . Lack of Transportation (Non-Medical): No  Physical Activity: Inactive  . Days of Exercise per Week: 0 days  .  Minutes of Exercise per Session: 0 min  Stress: No Stress Concern Present  . Feeling of Stress : Not at all  Social Connections: Socially Isolated  . Frequency of Communication with Friends and Family: More than three times a week  . Frequency of Social Gatherings with Friends and Family: Twice a week  . Attends Religious Services: Never  . Active Member of Clubs or Organizations: No  . Attends Archivist Meetings: Never  . Marital Status: Separated    Tobacco Counseling Counseling given: Yes   Clinical Intake:  Pre-visit preparation completed: Yes  Pain : No/denies pain Pain Score: 0-No pain     BMI - recorded: 27.73 Nutritional Status: BMI 25 -29 Overweight Nutritional Risks: None Diabetes: No  How often do you need to have someone help you when you read instructions, pamphlets, or other written materials from your doctor or pharmacy?: 1 - Never What is the last grade level you completed in school?: 12  Diabetic? no  Interpreter Needed?: No  Information entered by :: Laretta Bolster, LPN   Activities of Daily Living In your present state of health, do you have any difficulty performing the following activities: 01/15/2020  Hearing? N  Vision? N  Difficulty concentrating or making decisions? N  Walking or climbing stairs? N  Dressing or bathing? N  Doing errands, shopping? N  Preparing Food and eating ? N  Using the Toilet? N  In the past six months, have you accidently leaked urine? N  Do you have problems with loss of bowel control? N  Managing your Medications? N  Managing your Finances? N  Housekeeping or managing your Housekeeping? N  Some recent data might be hidden    Patient Care Team: Fayrene Helper, MD as PCP - [facility], Marga Melnick, MD (Inactive) (Gastroenterology) Rutherford Guys, MD as Consulting Physician (Ophthalmology)  Indicate any recent Medical Services you may have received from other than [facility] providers in the past year  (date may be approximate).     Assessment:   This is a routine wellness examination for Gina Black.  Hearing/Vision screen No exam data present  Dietary issues and exercise activities discussed: Current Exercise Habits: Home exercise routine;The patient does not participate in regular exercise at present  Goals    . DIET - INCREASE WATER INTAKE      Depression Screen PHQ 2/9 Scores 01/15/2020 01/15/2020 11/13/2019 07/31/2019 06/21/2019 01/12/2019 10/26/2018  PHQ - 2 Score 0 0 0 0 0 0 0    Fall Risk Fall Risk  01/15/2020 11/13/2019 07/31/2019 06/21/2019 05/01/2019  Falls in the past year? 1 1 1 1 1   Number falls in past yr: 0 0 0 1 0  Injury with Fall? 1 1 1 1 1   Risk for fall due to :  Impaired balance/gait - History of fall(s);Impaired balance/gait;Impaired mobility History of fall(s);Impaired mobility;Orthopedic patient -  Follow up Falls evaluation completed - Falls evaluation completed;Education provided;Falls prevention discussed Falls evaluation completed;Education provided;Falls prevention discussed -    FALL RISK PREVENTION PERTAINING TO THE HOME:  Any stairs in or around the home? No  If so, are there any without handrails? n/a Home free of loose throw rugs in walkways, pet beds, electrical cords, etc? Yes  Adequate lighting in your home to reduce risk of falls? Yes   ASSISTIVE DEVICES UTILIZED TO PREVENT FALLS:  Life alert? No  Use of a cane, walker or w/c? No  Grab bars in the bathroom? No  Shower chair or bench in shower? Yes  Elevated toilet seat or a handicapped toilet? No   TIMED UP AND GO:  Was the test performed? No .  Length of time to ambulate n/a   Cognitive Function:     6CIT Screen 01/15/2020 01/12/2019 01/03/2018 12/20/2016  What Year? 0 points 0 points 0 points 0 points  What month? 0 points 0 points 0 points 0 points  What time? 0 points 0 points 0 points 0 points  Count back from 20 0 points 0 points 0 points 0 points  Months in reverse 0 points 0  points 0 points 0 points  Repeat phrase 0 points 0 points 0 points 0 points  Total Score 0 0 0 0    Immunizations Immunization History  Administered Date(s) Administered  . Fluad Quad(high Dose 65+) 11/13/2019  . H1N1 02/07/2008  . Influenza Split 12/20/2011  . Influenza Whole 11/19/2009  . Influenza,inj,Quad PF,6+ Mos 12/18/2012, 11/19/2013, 01/16/2015, 11/20/2015, 12/20/2016, 10/13/2017, 10/26/2018  . Moderna SARS-COVID-2 Vaccination 05/11/2019, 06/13/2019  . Pneumococcal Conjugate-13 09/12/2013  . Pneumococcal Polysaccharide-23 01/03/2018  . Td 07/24/2003  . Tdap 12/18/2012    TDAP status: Up to date  Flu Vaccine status: Up to date  Pneumococcal vaccine status: Up to date  Covid-19 vaccine status: Completed vaccines  Qualifies for Shingles Vaccine? Yes   Zostavax completed No   Shingrix Completed?: No.    Education has been provided regarding the importance of this vaccine. Patient has been advised to call insurance company to determine out of pocket expense if they have not yet received this vaccine. Advised may also receive vaccine at local pharmacy or Health Dept. Verbalized acceptance and understanding.  Screening Tests Health Maintenance  Topic Date Due  . DEXA SCAN  07/21/2019  . PAP SMEAR-Modifier  09/25/2019  . MAMMOGRAM  01/13/2020  . TETANUS/TDAP  12/19/2022  . PNA vac Low Risk Adult (2 of 2 - PPSV23) 01/04/2023  . COLONOSCOPY  02/07/2028  . INFLUENZA VACCINE  Completed  . COVID-19 Vaccine  Completed  . Hepatitis C Screening  Completed  . HIV Screening  Completed    Health Maintenance  Health Maintenance Due  Topic Date Due  . DEXA SCAN  07/21/2019  . PAP SMEAR-Modifier  09/25/2019  . MAMMOGRAM  01/13/2020    Colorectal cancer screening: Type of screening: Colonoscopy. Completed 02/06/2018. Repeat every 10 years   Bone Density status: Completed 03/03/09. Results reflect: Bone density results: NORMAL. Repeat every 5 years.  Lung Cancer  Screening: (Low Dose CT Chest recommended if Age 24-80 years, 30 pack-year currently smoking OR have quit w/in 15years.) does not qualify.   Lung Cancer Screening Referral: n/a  Additional Screening:  Hepatitis C Screening: does not qualify  Vision Screening: Recommended annual ophthalmology exams for early detection of glaucoma and other  disorders of the eye. Is the patient up to date with their annual eye exam?  No  Who is the provider or what is the name of the office in which the patient attends annual eye exams? Does not have one currently.  If pt is not established with a provider, would they like to be referred to a provider to establish care? No .   Dental Screening: Recommended annual dental exams for proper oral hygiene  Community Resource Referral / Chronic Care Management: CRR required this visit?  No   CCM required this visit?  No      Plan:    1. Encounter for Medicare annual wellness exam  - MM 3D SCREEN BREAST BILATERAL; Future - DG Bone Density; Future  2. Encounter for screening mammogram for malignant neoplasm of breast  - MM 3D SCREEN BREAST BILATERAL; Future  3. Encounter for screening for osteoporosis  - DG Bone Density; Future   I have personally reviewed and noted the following in the patient's chart:   . Medical and social history . Use of alcohol, tobacco or illicit drugs  . Current medications and supplements . Functional ability and status . Nutritional status . Physical activity . Advanced directives . List of other physicians . Hospitalizations, surgeries, and ER visits in previous 12 months . Vitals . Screenings to include cognitive, depression, and falls . Referrals and appointments  In addition, I have reviewed and discussed with patient certain preventive protocols, quality metrics, and best practice recommendations. A written personalized care plan for preventive services as well as general preventive health recommendations were  provided to patient.     Perlie Mayo, NP   01/15/2020   Nurse Notes: AWV conducted by nurse in office with patient consent. Patient and Provider both in office. Visit took 20 minutes to complete.  Visit in office. No additional concerns or questions for provider. Agreed with the above documentation.

## 2020-01-15 NOTE — Patient Instructions (Addendum)
Gina Black , Thank you for taking time to come for your Medicare Wellness Visit. I appreciate your ongoing commitment to your health goals. Please review the following plan we discussed and let me know if I can assist you in the future.   Merry Christmas and Happy New Year!!!  Screening recommendations/referrals: Colonoscopy: 02/07/28 Mammogram: Ordered today for January Bone Density: Ordered today for January Recommended yearly ophthalmology/optometry visit for glaucoma screening and checkup Recommended yearly dental visit for hygiene and checkup  Vaccinations: Influenza vaccine: Fall 2022 Pneumococcal vaccine: Complete Tdap vaccine: 12/19/22 Shingles vaccine: Declined    Advanced directives: No  Conditions/risks identified: None  Next appointment: 03/17/20 @ 1:20 pm   Preventive Care 65 Years and Older, Female Preventive care refers to lifestyle choices and visits with your health care provider that can promote health and wellness. What does preventive care include?  A yearly physical exam. This is also called an annual well check.  Dental exams once or twice a year.  Routine eye exams. Ask your health care provider how often you should have your eyes checked.  Personal lifestyle choices, including:  Daily care of your teeth and gums.  Regular physical activity.  Eating a healthy diet.  Avoiding tobacco and drug use.  Limiting alcohol use.  Practicing safe sex.  Taking low-dose aspirin every day.  Taking vitamin and mineral supplements as recommended by your health care provider. What happens during an annual well check? The services and screenings done by your health care provider during your annual well check will depend on your age, overall health, lifestyle risk factors, and family history of disease. Counseling  Your health care provider may ask you questions about your:  Alcohol use.  Tobacco use.  Drug use.  Emotional well-being.  Home and  relationship well-being.  Sexual activity.  Eating habits.  History of falls.  Memory and ability to understand (cognition).  Work and work Statistician.  Reproductive health. Screening  You may have the following tests or measurements:  Height, weight, and BMI.  Blood pressure.  Lipid and cholesterol levels. These may be checked every 5 years, or more frequently if you are over 71 years old.  Skin check.  Lung cancer screening. You may have this screening every year starting at age 42 if you have a 30-pack-year history of smoking and currently smoke or have quit within the past 15 years.  Fecal occult blood test (FOBT) of the stool. You may have this test every year starting at age 40.  Flexible sigmoidoscopy or colonoscopy. You may have a sigmoidoscopy every 5 years or a colonoscopy every 10 years starting at age 86.  Hepatitis C blood test.  Hepatitis B blood test.  Sexually transmitted disease (STD) testing.  Diabetes screening. This is done by checking your blood sugar (glucose) after you have not eaten for a while (fasting). You may have this done every 1-3 years.  Bone density scan. This is done to screen for osteoporosis. You may have this done starting at age 72.  Mammogram. This may be done every 1-2 years. Talk to your health care provider about how often you should have regular mammograms. Talk with your health care provider about your test results, treatment options, and if necessary, the need for more tests. Vaccines  Your health care provider may recommend certain vaccines, such as:  Influenza vaccine. This is recommended every year.  Tetanus, diphtheria, and acellular pertussis (Tdap, Td) vaccine. You may need a Td booster every 10 years.  Zoster vaccine. You may need this after age 70.  Pneumococcal 13-valent conjugate (PCV13) vaccine. One dose is recommended after age 52.  Pneumococcal polysaccharide (PPSV23) vaccine. One dose is recommended after  age 40. Talk to your health care provider about which screenings and vaccines you need and how often you need them. This information is not intended to replace advice given to you by your health care provider. Make sure you discuss any questions you have with your health care provider. Document Released: 02/21/2015 Document Revised: 10/15/2015 Document Reviewed: 11/26/2014 Elsevier Interactive Patient Education  2017 [place] Prevention in the Home Falls can cause injuries. They can happen to people of all ages. There are many things you can do to make your home safe and to help prevent falls. What can I do on the outside of my home?  Regularly fix the edges of walkways and driveways and fix any cracks.  Remove anything that might make you trip as you walk through a door, such as a raised step or threshold.  Trim any bushes or trees on the path to your home.  Use bright outdoor lighting.  Clear any walking paths of anything that might make someone trip, such as rocks or tools.  Regularly check to see if handrails are loose or broken. Make sure that both sides of any steps have handrails.  Any raised decks and porches should have guardrails on the edges.  Have any leaves, snow, or ice cleared regularly.  Use sand or salt on walking paths during winter.  Clean up any spills in your garage right away. This includes oil or grease spills. What can I do in the bathroom?  Use night lights.  Install grab bars by the toilet and in the tub and shower. Do not use towel bars as grab bars.  Use non-skid mats or decals in the tub or shower.  If you need to sit down in the shower, use a plastic, non-slip stool.  Keep the floor dry. Clean up any water that spills on the floor as soon as it happens.  Remove soap buildup in the tub or shower regularly.  Attach bath mats securely with double-sided non-slip rug tape.  Do not have throw rugs and other things on the floor that can  make you trip. What can I do in the bedroom?  Use night lights.  Make sure that you have a light by your bed that is easy to reach.  Do not use any sheets or blankets that are too big for your bed. They should not hang down onto the floor.  Have a firm chair that has side arms. You can use this for support while you get dressed.  Do not have throw rugs and other things on the floor that can make you trip. What can I do in the kitchen?  Clean up any spills right away.  Avoid walking on wet floors.  Keep items that you use a lot in easy-to-reach places.  If you need to reach something above you, use a strong step stool that has a grab bar.  Keep electrical cords out of the way.  Do not use floor polish or wax that makes floors slippery. If you must use wax, use non-skid floor wax.  Do not have throw rugs and other things on the floor that can make you trip. What can I do with my stairs?  Do not leave any items on the stairs.  Make sure that there are  handrails on both sides of the stairs and use them. Fix handrails that are broken or loose. Make sure that handrails are as long as the stairways.  Check any carpeting to make sure that it is firmly attached to the stairs. Fix any carpet that is loose or worn.  Avoid having throw rugs at the top or bottom of the stairs. If you do have throw rugs, attach them to the floor with carpet tape.  Make sure that you have a light switch at the top of the stairs and the bottom of the stairs. If you do not have them, ask someone to add them for you. What else can I do to help prevent falls?  Wear shoes that:  Do not have high heels.  Have rubber bottoms.  Are comfortable and fit you well.  Are closed at the toe. Do not wear sandals.  If you use a stepladder:  Make sure that it is fully opened. Do not climb a closed stepladder.  Make sure that both sides of the stepladder are locked into place.  Ask someone to hold it for you,  if possible.  Clearly mark and make sure that you can see:  Any grab bars or handrails.  First and last steps.  Where the edge of each step is.  Use tools that help you move around (mobility aids) if they are needed. These include:  Canes.  Walkers.  Scooters.  Crutches.  Turn on the lights when you go into a dark area. Replace any light bulbs as soon as they burn out.  Set up your furniture so you have a clear path. Avoid moving your furniture around.  If any of your floors are uneven, fix them.  If there are any pets around you, be aware of where they are.  Review your medicines with your doctor. Some medicines can make you feel dizzy. This can increase your chance of falling. Ask your doctor what other things that you can do to help prevent falls. This information is not intended to replace advice given to you by your health care provider. Make sure you discuss any questions you have with your health care provider. Document Released: 11/21/2008 Document Revised: 07/03/2015 Document Reviewed: 03/01/2014 Elsevier Interactive Patient Education  2017 [employer].

## 2020-02-04 ENCOUNTER — Encounter: Payer: Self-pay | Admitting: Family Medicine

## 2020-02-22 ENCOUNTER — Other Ambulatory Visit: Payer: Self-pay | Admitting: Family Medicine

## 2020-03-17 ENCOUNTER — Other Ambulatory Visit: Payer: Self-pay

## 2020-03-17 ENCOUNTER — Encounter: Payer: Self-pay | Admitting: Family Medicine

## 2020-03-17 ENCOUNTER — Telehealth (INDEPENDENT_AMBULATORY_CARE_PROVIDER_SITE_OTHER): Payer: Medicare Other | Admitting: Family Medicine

## 2020-03-17 VITALS — Ht 67.0 in | Wt 170.0 lb

## 2020-03-17 DIAGNOSIS — I10 Essential (primary) hypertension: Secondary | ICD-10-CM

## 2020-03-22 NOTE — Progress Notes (Signed)
Unable to connect with patient despite multiple attempts, appointment is to be rescheduled, preferably in the office as connecting via tele has proven to be difficult, and I do  Not anticipate a change

## 2020-04-24 ENCOUNTER — Ambulatory Visit (HOSPITAL_COMMUNITY)
Admission: RE | Admit: 2020-04-24 | Discharge: 2020-04-24 | Disposition: A | Discharge status: Home / Self Care | Payer: Medicare Other | Source: Ambulatory Visit | Attending: Family Medicine | Admitting: Family Medicine

## 2020-04-24 ENCOUNTER — Other Ambulatory Visit: Payer: Self-pay

## 2020-04-24 DIAGNOSIS — Z1231 Encounter for screening mammogram for malignant neoplasm of breast: Secondary | ICD-10-CM | POA: Insufficient documentation

## 2020-05-22 ENCOUNTER — Other Ambulatory Visit: Payer: Self-pay | Admitting: Family Medicine

## 2020-07-05 ENCOUNTER — Encounter: Payer: Self-pay | Admitting: Family Medicine

## 2020-07-10 ENCOUNTER — Telehealth: Payer: Self-pay

## 2020-07-10 NOTE — Telephone Encounter (Signed)
I don't see any physican form but it would need to be printed and sleeved and documented before the physician fills it out

## 2020-07-10 NOTE — Telephone Encounter (Signed)
Certification of Disability Copied Sleeved Noted

## 2020-07-22 ENCOUNTER — Telehealth: Payer: Self-pay

## 2020-07-22 DIAGNOSIS — Z0279 Encounter for issue of other medical certificate: Secondary | ICD-10-CM

## 2020-07-22 NOTE — Telephone Encounter (Signed)
Patient daughter Gina Black informed Certification of Disability form ready to be picked up.  Copied.

## 2020-07-22 NOTE — Telephone Encounter (Signed)
Contacted patient daughter will have care taker pick up Cert of Disability on Wednesday, 07/23/20.

## 2020-07-28 ENCOUNTER — Other Ambulatory Visit: Payer: Self-pay | Admitting: Family Medicine

## 2020-10-08 ENCOUNTER — Other Ambulatory Visit: Payer: Self-pay | Admitting: Family Medicine

## 2020-10-23 ENCOUNTER — Other Ambulatory Visit: Payer: Self-pay | Admitting: Family Medicine

## 2021-01-17 ENCOUNTER — Other Ambulatory Visit: Payer: Self-pay | Admitting: *Deleted

## 2021-01-17 ENCOUNTER — Ambulatory Visit (INDEPENDENT_AMBULATORY_CARE_PROVIDER_SITE_OTHER): Payer: Medicare Other | Admitting: *Deleted

## 2021-01-17 ENCOUNTER — Other Ambulatory Visit: Payer: Self-pay

## 2021-01-17 DIAGNOSIS — Z780 Asymptomatic menopausal state: Secondary | ICD-10-CM | POA: Diagnosis not present

## 2021-01-17 DIAGNOSIS — Z0100 Encounter for examination of eyes and vision without abnormal findings: Secondary | ICD-10-CM

## 2021-01-17 DIAGNOSIS — Z0000 Encounter for general adult medical examination without abnormal findings: Secondary | ICD-10-CM

## 2021-01-17 MED ORDER — POTASSIUM CHLORIDE ER 10 MEQ PO TBCR: 10.0000 meq | EXTENDED_RELEASE_TABLET | Freq: Two times a day (BID) | ORAL | 0 refills | Status: DC

## 2021-01-17 NOTE — Patient Instructions (Signed)
Gina Black , Thank you for taking time to come for your Medicare Wellness Visit. I appreciate your ongoing commitment to your health goals. Please review the following plan we discussed and let me know if I can assist you in the future.   Screening recommendations/referrals: Colonoscopy: up to date Mammogram: up to date Bone Density: ordered Recommended yearly ophthalmology/optometry visit for glaucoma screening and checkup Recommended yearly dental visit for hygiene and checkup  Vaccinations: Influenza vaccine: due now Pneumococcal vaccine: completed Tdap vaccine: completed Shingles vaccine: due now    Advanced directives: information provided  Conditions/risks identified: hypertension  Next appointment: 1 year    Preventive Care 29 Years and Older, Female Preventive care refers to lifestyle choices and visits with your health care provider that can promote health and wellness. What does preventive care include? A yearly physical exam. This is also called an annual well check. Dental exams once or twice a year. Routine eye exams. Ask your health care provider how often you should have your eyes checked. Personal lifestyle choices, including: Daily care of your teeth and gums. Regular physical activity. Eating a healthy diet. Avoiding tobacco and drug use. Limiting alcohol use. Practicing safe sex. Taking low-dose aspirin every day. Taking vitamin and mineral supplements as recommended by your health care provider. What happens during an annual well check? The services and screenings done by your health care provider during your annual well check will depend on your age, overall health, lifestyle risk factors, and family history of disease. Counseling  Your health care provider may ask you questions about your: Alcohol use. Tobacco use. Drug use. Emotional well-being. Home and relationship well-being. Sexual activity. Eating habits. History of falls. Memory and  ability to understand (cognition). Work and work Statistician. Reproductive health. Screening  You may have the following tests or measurements: Height, weight, and BMI. Blood pressure. Lipid and cholesterol levels. These may be checked every 5 years, or more frequently if you are over 23 years old. Skin check. Lung cancer screening. You may have this screening every year starting at age 71 if you have a 30-pack-year history of smoking and currently smoke or have quit within the past 15 years. Fecal occult blood test (FOBT) of the stool. You may have this test every year starting at age 31. Flexible sigmoidoscopy or colonoscopy. You may have a sigmoidoscopy every 5 years or a colonoscopy every 10 years starting at age 17. Hepatitis C blood test. Hepatitis B blood test. Sexually transmitted disease (STD) testing. Diabetes screening. This is done by checking your blood sugar (glucose) after you have not eaten for a while (fasting). You may have this done every 1-3 years. Bone density scan. This is done to screen for osteoporosis. You may have this done starting at age 89. Mammogram. This may be done every 1-2 years. Talk to your health care provider about how often you should have regular mammograms. Talk with your health care provider about your test results, treatment options, and if necessary, the need for more tests. Vaccines  Your health care provider may recommend certain vaccines, such as: Influenza vaccine. This is recommended every year. Tetanus, diphtheria, and acellular pertussis (Tdap, Td) vaccine. You may need a Td booster every 10 years. Zoster vaccine. You may need this after age 40. Pneumococcal 13-valent conjugate (PCV13) vaccine. One dose is recommended after age 86. Pneumococcal polysaccharide (PPSV23) vaccine. One dose is recommended after age 44. Talk to your health care provider about which screenings and vaccines you need and  how often you need them. This information is  not intended to replace advice given to you by your health care provider. Make sure you discuss any questions you have with your health care provider. Document Released: 02/21/2015 Document Revised: 10/15/2015 Document Reviewed: 11/26/2014 Elsevier Interactive Patient Education  2017 [place] Prevention in the Home Falls can cause injuries. They can happen to people of all ages. There are many things you can do to make your home safe and to help prevent falls. What can I do on the outside of my home? Regularly fix the edges of walkways and driveways and fix any cracks. Remove anything that might make you trip as you walk through a door, such as a raised step or threshold. Trim any bushes or trees on the path to your home. Use bright outdoor lighting. Clear any walking paths of anything that might make someone trip, such as rocks or tools. Regularly check to see if handrails are loose or broken. Make sure that both sides of any steps have handrails. Any raised decks and porches should have guardrails on the edges. Have any leaves, snow, or ice cleared regularly. Use sand or salt on walking paths during winter. Clean up any spills in your garage right away. This includes oil or grease spills. What can I do in the bathroom? Use night lights. Install grab bars by the toilet and in the tub and shower. Do not use towel bars as grab bars. Use non-skid mats or decals in the tub or shower. If you need to sit down in the shower, use a plastic, non-slip stool. Keep the floor dry. Clean up any water that spills on the floor as soon as it happens. Remove soap buildup in the tub or shower regularly. Attach bath mats securely with double-sided non-slip rug tape. Do not have throw rugs and other things on the floor that can make you trip. What can I do in the bedroom? Use night lights. Make sure that you have a light by your bed that is easy to reach. Do not use any sheets or blankets that  are too big for your bed. They should not hang down onto the floor. Have a firm chair that has side arms. You can use this for support while you get dressed. Do not have throw rugs and other things on the floor that can make you trip. What can I do in the kitchen? Clean up any spills right away. Avoid walking on wet floors. Keep items that you use a lot in easy-to-reach places. If you need to reach something above you, use a strong step stool that has a grab bar. Keep electrical cords out of the way. Do not use floor polish or wax that makes floors slippery. If you must use wax, use non-skid floor wax. Do not have throw rugs and other things on the floor that can make you trip. What can I do with my stairs? Do not leave any items on the stairs. Make sure that there are handrails on both sides of the stairs and use them. Fix handrails that are broken or loose. Make sure that handrails are as long as the stairways. Check any carpeting to make sure that it is firmly attached to the stairs. Fix any carpet that is loose or worn. Avoid having throw rugs at the top or bottom of the stairs. If you do have throw rugs, attach them to the floor with carpet tape. Make sure that you have  a light switch at the top of the stairs and the bottom of the stairs. If you do not have them, ask someone to add them for you. What else can I do to help prevent falls? Wear shoes that: Do not have high heels. Have rubber bottoms. Are comfortable and fit you well. Are closed at the toe. Do not wear sandals. If you use a stepladder: Make sure that it is fully opened. Do not climb a closed stepladder. Make sure that both sides of the stepladder are locked into place. Ask someone to hold it for you, if possible. Clearly mark and make sure that you can see: Any grab bars or handrails. First and last steps. Where the edge of each step is. Use tools that help you move around (mobility aids) if they are needed. These  include: Canes. Walkers. Scooters. Crutches. Turn on the lights when you go into a dark area. Replace any light bulbs as soon as they burn out. Set up your furniture so you have a clear path. Avoid moving your furniture around. If any of your floors are uneven, fix them. If there are any pets around you, be aware of where they are. Review your medicines with your doctor. Some medicines can make you feel dizzy. This can increase your chance of falling. Ask your doctor what other things that you can do to help prevent falls. This information is not intended to replace advice given to you by your health care provider. Make sure you discuss any questions you have with your health care provider. Document Released: 11/21/2008 Document Revised: 07/03/2015 Document Reviewed: 03/01/2014 Elsevier Interactive Patient Education  2017 [employer].

## 2021-01-17 NOTE — Progress Notes (Signed)
Subjective:   Gina Black is a 65 y.o. female who presents for Medicare Annual (Subsequent) preventive examination.  I connected with  Gina Black on 01/17/21 by a audio enabled telemedicine application and verified that I am speaking with the correct person using two identifiers.  Patient Location: Home  Provider Location: Home Office  I discussed the limitations of evaluation and management by telemedicine. The patient expressed understanding and agreed to proceed.  Review of Systems     Gina Black , Thank you for taking time to come for your Medicare Wellness Visit. I appreciate your ongoing commitment to your health goals. Please review the following plan we discussed and let me know if I can assist you in the future.   These are the goals we discussed:  Goals      DIET - INCREASE WATER INTAKE        This is a list of the screening recommended for you and due dates:  Health Maintenance  Topic Date Due   Zoster (Shingles) Vaccine (1 of 2) Never done   DEXA scan (bone density measurement)  07/21/2019   COVID-19 Vaccine (3 - Booster for Moderna series) 08/08/2019   Flu Shot  09/08/2020   Mammogram  04/25/2022   Tetanus Vaccine  12/19/2022   Pneumonia Vaccine (3) 01/04/2023   Colon Cancer Screening  02/07/2028   Hepatitis C Screening: USPSTF Recommendation to screen - Ages 18-79 yo.  Completed   HPV Vaccine  Aged Out          Objective:    There were no vitals filed for this visit. There is no height or weight on file to calculate BMI.  Advanced Directives 04/30/2019 02/06/2018 01/03/2018 12/20/2016  Does Patient Have a Medical Advance Directive? No No No No  Would patient like information on creating a medical advance directive? - No - Patient declined Yes (ED - Information included in AVS) Yes (MAU/Ambulatory/Procedural Areas - Information given)    Current Medications (verified) Outpatient Encounter Medications as of 01/17/2021  Medication  Sig   Ascorbic Acid (VITAMIN C PO) Take 1 tablet by mouth daily.   Calcium Carbonate-Vitamin D (CALCIUM 600+D PO) Take 1 tablet by mouth daily.   ciclopirox (PENLAC) 8 % solution APPLY TO THE AFFECTED AREA ONCE DAILY.   Multiple Vitamin (MULTIVITAMIN) tablet Take 1 tablet by mouth daily.   potassium chloride (KLOR-CON) 10 MEQ tablet TAKE ONE TABLET BY MOUTH 2 TIMES A DAY.   triamterene-hydrochlorothiazide (MAXZIDE-25) 37.5-25 MG tablet TAKE ONE TABLET BY MOUTH DAILY.   No facility-administered encounter medications on file as of 01/17/2021.    Allergies (verified) Ciprofloxacin and Prednisone   History: Past Medical History:  Diagnosis Date   Adenomatous polyp of colon 09/25/2016   Dx in 01/2018 needs rept colonoscopy in 01/2023 per Dr Oneida Alar   Decreased vision 08/22/2015   Hemorrhoids    Hyperlipidemia    Hypertension    Need for immunization against influenza 11/23/2015   Plantar callus 09/25/2016   Psychotic disorder [facility]) August 2009   Hopitalised Involuntary commited by daughter    S/P colonoscopy 03/20/2008   Dr Oneida Alar normal, except hemorrhoids   Past Surgical History:  Procedure Laterality Date   CHOLECYSTECTOMY  2001   COLONOSCOPY N/A 02/06/2018   Procedure: COLONOSCOPY;  Surgeon: Danie Binder, MD;  Location: [facility];  Service: Endoscopy;  Laterality: N/A;  1:15   ECTOPIC PREGNANCY SURGERY  1986   right    POLYPECTOMY  02/06/2018   Procedure:  POLYPECTOMY;  Surgeon: Danie Binder, MD;  Location: [facility];  Service: Endoscopy;;  ascending colon   Family History  Problem Relation Age of Onset   Hypertension Mother    Diabetes Mother    Kidney disease Mother    Diabetes Father    Colon cancer Neg Hx    Colon polyps Neg Hx    Social History   Socioeconomic History   Marital status: Widowed    Spouse name: Not on file   Number of children: 1   Years of education: 35   Highest education level: 12th grade  Occupational History   Occupation:  disabled    Comment: previously Training and development officer  Tobacco Use   Smoking status: Former    Packs/day: 1.00    Years: 6.00    Pack years: 6.00    Types: Cigarettes    Quit date: 04/28/2010    Years since quitting: 10.7   Smokeless tobacco: Never  Vaping Use   Vaping Use: Never used  Substance and Sexual Activity   Alcohol use: No   Drug use: No   Sexual activity: Not Currently  Other Topics Concern   Not on file  Social History Narrative   Lives alone.  Daughter in [place].   Social Determinants of Health   Financial Resource Strain: Not on file  Food Insecurity: Not on file  Transportation Needs: Not on file  Physical Activity: Not on file  Stress: Not on file  Social Connections: Not on file    Tobacco Counseling Counseling given: Not Answered   Clinical Intake:                 Diabetic?no         Activities of Daily Living No flowsheet data found.  Patient Care Team: Fayrene Helper, MD as PCP - [facility], Marga Melnick, MD (Inactive) (Gastroenterology) Rutherford Guys, MD as Consulting Physician (Ophthalmology)  Indicate any recent Medical Services you may have received from other than [facility] providers in the past year (date may be approximate).     Assessment:   This is a routine wellness examination for [name].  Hearing/Vision screen No results found.  Dietary issues and exercise activities discussed:     Goals Addressed   None   Depression Screen PHQ 2/9 Scores 03/17/2020 01/15/2020 01/15/2020 11/13/2019 07/31/2019 06/21/2019 01/12/2019  PHQ - 2 Score 0 0 0 0 0 0 0    Fall Risk Fall Risk  03/17/2020 01/15/2020 11/13/2019 07/31/2019 06/21/2019  Falls in the past year? 0 1 1 1 1   Number falls in past yr: 0 0 0 0 1  Injury with Fall? 0 1 1 1 1   Risk for fall due to : No Fall Risks Impaired balance/gait - History of fall(s);Impaired balance/gait;Impaired mobility History of fall(s);Impaired mobility;Orthopedic patient  Follow up Falls evaluation  completed Falls evaluation completed - Falls evaluation completed;Education provided;Falls prevention discussed Falls evaluation completed;Education provided;Falls prevention discussed    FALL RISK PREVENTION PERTAINING TO THE HOME:  Any stairs in or around the home? No  If so, are there any without handrails? No  Home free of loose throw rugs in walkways, pet beds, electrical cords, etc? Yes  Adequate lighting in your home to reduce risk of falls? Yes   ASSISTIVE DEVICES UTILIZED TO PREVENT FALLS:  Life alert? No  Use of a cane, walker or w/c? No  Grab bars in the bathroom? No  Shower chair or bench in shower? Yes  Elevated toilet seat  or a handicapped toilet? No    Cognitive Function:     6CIT Screen 01/15/2020 01/12/2019 01/03/2018 12/20/2016  What Year? 0 points 0 points 0 points 0 points  What month? 0 points 0 points 0 points 0 points  What time? 0 points 0 points 0 points 0 points  Count back from 20 0 points 0 points 0 points 0 points  Months in reverse 0 points 0 points 0 points 0 points  Repeat phrase 0 points 0 points 0 points 0 points  Total Score 0 0 0 0    Immunizations Immunization History  Administered Date(s) Administered   Fluad Quad(high Dose 65+) 11/13/2019   H1N1 02/07/2008   Influenza Split 12/20/2011   Influenza Whole 11/19/2009   Influenza,inj,Quad PF,6+ Mos 12/18/2012, 11/19/2013, 01/16/2015, 11/20/2015, 12/20/2016, 10/13/2017, 10/26/2018   Moderna Sars-Covid-2 Vaccination 05/11/2019, 06/13/2019   Pneumococcal Conjugate-13 09/12/2013   Pneumococcal Polysaccharide-23 01/03/2018   Td 07/24/2003   Tdap 12/18/2012    TDAP status: Up to date  Flu Vaccine status: Due, Education has been provided regarding the importance of this vaccine. Advised may receive this vaccine at local pharmacy or Health Dept. Aware to provide a copy of the vaccination record if obtained from local pharmacy or Health Dept. Verbalized acceptance and  understanding.  Pneumococcal vaccine status: Up to date  Covid-19 vaccine status: Completed vaccines  Qualifies for Shingles Vaccine? Yes   Zostavax completed No   Shingrix Completed?: No.    Education has been provided regarding the importance of this vaccine. Patient has been advised to call insurance company to determine out of pocket expense if they have not yet received this vaccine. Advised may also receive vaccine at local pharmacy or Health Dept. Verbalized acceptance and understanding.  Screening Tests Health Maintenance  Topic Date Due   Zoster Vaccines- Shingrix (1 of 2) Never done   DEXA SCAN  07/21/2019   COVID-19 Vaccine (3 - Booster for Moderna series) 08/08/2019   INFLUENZA VACCINE  09/08/2020   MAMMOGRAM  04/25/2022   TETANUS/TDAP  12/19/2022   Pneumonia Vaccine 69+ Years old (3) 01/04/2023   COLONOSCOPY (Pts 45-75yrs Insurance coverage will need to be confirmed)  02/07/2028   Hepatitis C Screening  Completed   HPV VACCINES  Aged Out    Health Maintenance  Health Maintenance Due  Topic Date Due   Zoster Vaccines- Shingrix (1 of 2) Never done   DEXA SCAN  07/21/2019   COVID-19 Vaccine (3 - Booster for Moderna series) 08/08/2019   INFLUENZA VACCINE  09/08/2020    Colorectal cancer screening: Type of screening: Colonoscopy. Completed 02-06-18. Repeat every 10 years  Mammogram status: Completed 04-24-20. Repeat every year  Bone Density status: Ordered 01-17-21. Pt provided with contact info and advised to call to schedule appt.  Lung Cancer Screening: (Low Dose CT Chest recommended if Age 82-80 years, 30 pack-year currently smoking OR have quit w/in 15years.) does not qualify.  Additional Screening:  Hepatitis C Screening: does qualify; Completed 06-21-15  Vision Screening: Recommended annual ophthalmology exams for early detection of glaucoma and other disorders of the eye. Is the patient up to date with their annual eye exam?  No  Who is the provider or  what is the name of the office in which the patient attends annual eye exams? Pt will call us back If pt is not established with a provider, would they like to be referred to a provider to establish care? Yes .   Dental Screening: Recommended annual dental exams  for proper oral hygiene  Community Resource Referral / Chronic Care Management: CRR required this visit?  No   CCM required this visit?  No      Plan:     I have personally reviewed and noted the following in the patient's chart:   Medical and social history Use of alcohol, tobacco or illicit drugs  Current medications and supplements including opioid prescriptions.  Functional ability and status Nutritional status Physical activity Advanced directives List of other physicians Hospitalizations, surgeries, and ER visits in previous 12 months Vitals Screenings to include cognitive, depression, and falls Referrals and appointments  In addition, I have reviewed and discussed with patient certain preventive protocols, quality metrics, and best practice recommendations. A written personalized care plan for preventive services as well as general preventive health recommendations were provided to patient.     Shelda Altes, CMA   01/17/2021   Nurse Notes:  Ms. Okeefe , Thank you for taking time to come for your Medicare Wellness Visit. I appreciate your ongoing commitment to your health goals. Please review the following plan we discussed and let me know if I can assist you in the future.   These are the goals we discussed:  Goals      DIET - INCREASE WATER INTAKE        This is a list of the screening recommended for you and due dates:  Health Maintenance  Topic Date Due   Zoster (Shingles) Vaccine (1 of 2) Never done   DEXA scan (bone density measurement)  07/21/2019   COVID-19 Vaccine (3 - Booster for Moderna series) 08/08/2019   Flu Shot  09/08/2020   Mammogram  04/25/2022   Tetanus Vaccine  12/19/2022    Pneumonia Vaccine (3) 01/04/2023   Colon Cancer Screening  02/07/2028   Hepatitis C Screening: USPSTF Recommendation to screen - Ages 18-79 yo.  Completed   HPV Vaccine  Aged Out

## 2021-01-29 ENCOUNTER — Other Ambulatory Visit: Payer: Self-pay | Admitting: Family Medicine

## 2022-01-21 ENCOUNTER — Ambulatory Visit (INDEPENDENT_AMBULATORY_CARE_PROVIDER_SITE_OTHER): Payer: No Typology Code available for payment source | Admitting: Internal Medicine

## 2022-01-21 ENCOUNTER — Encounter: Payer: Self-pay | Admitting: Internal Medicine

## 2022-01-21 VITALS — BP 163/85 | HR 79 | Ht 67.0 in | Wt 177.8 lb

## 2022-01-21 DIAGNOSIS — Z2821 Immunization not carried out because of patient refusal: Secondary | ICD-10-CM | POA: Diagnosis not present

## 2022-01-21 DIAGNOSIS — Z0001 Encounter for general adult medical examination with abnormal findings: Secondary | ICD-10-CM | POA: Diagnosis not present

## 2022-01-21 DIAGNOSIS — I10 Essential (primary) hypertension: Secondary | ICD-10-CM

## 2022-01-21 MED ORDER — TRIAMTERENE-HCTZ 37.5-25 MG PO TABS: 1.0000 | ORAL_TABLET | Freq: Every day | ORAL | 2 refills | Status: DC

## 2022-01-21 NOTE — Progress Notes (Unsigned)
   Acute Office Visit  Subjective:     Patient ID: Gina Black, female    DOB: 06/26/1955, 66 y.o.   MRN: [id number]  Chief Complaint  Patient presents with   Hypertension    BP check Medical clearance for Dentist    HPI Patient is in today for ***  ROS      Objective:    BP (!) 172/83   Pulse 79   Ht '5\' 7"'$  (1.702 m)   Wt 177 lb 12.8 oz (80.6 kg)   SpO2 97%   BMI 27.85 kg/m  BP Readings from Last 3 Encounters:  01/21/22 (!) 172/83  01/15/20 (!) 142/80  11/13/19 (!) 144/82      Physical Exam  No results found for any visits on 01/21/22.      Assessment & Plan:   Problem List Items Addressed This Visit   None   No orders of the defined types were placed in this encounter.   No follow-ups on file.  Johnette Abraham, MD

## 2022-01-21 NOTE — Patient Instructions (Signed)
It was a pleasure to see you today.  Thank you for giving [place] the opportunity to be involved in your care.  Below is a brief recap of your visit and next steps.  We will plan to see you again in 4 weeks.  Summary I have refilled your blood pressure medication. We will check labs today and plan for follow up in 4 weeks.

## 2022-01-22 ENCOUNTER — Ambulatory Visit: Payer: Medicare Other | Admitting: Family Medicine

## 2022-01-25 DIAGNOSIS — Z139 Encounter for screening, unspecified: Secondary | ICD-10-CM | POA: Diagnosis not present

## 2022-01-25 DIAGNOSIS — E785 Hyperlipidemia, unspecified: Secondary | ICD-10-CM | POA: Diagnosis not present

## 2022-01-25 DIAGNOSIS — I10 Essential (primary) hypertension: Secondary | ICD-10-CM | POA: Diagnosis not present

## 2022-01-25 DIAGNOSIS — Z0001 Encounter for general adult medical examination with abnormal findings: Secondary | ICD-10-CM | POA: Diagnosis not present

## 2022-01-25 DIAGNOSIS — E559 Vitamin D deficiency, unspecified: Secondary | ICD-10-CM | POA: Diagnosis not present

## 2022-01-26 LAB — HEMOGLOBIN A1C
Est. average glucose Bld gHb Est-mCnc: 120 mg/dL
Hgb A1c MFr Bld: 5.8 % — ABNORMAL HIGH (ref 4.8–5.6)

## 2022-01-26 LAB — CBC WITH DIFFERENTIAL/PLATELET
Basophils Absolute: 0 10*3/uL (ref 0.0–0.2)
Basos: 1 %
EOS (ABSOLUTE): 0.1 10*3/uL (ref 0.0–0.4)
Eos: 2 %
Hematocrit: 40 % (ref 34.0–46.6)
Hemoglobin: 13.1 g/dL (ref 11.1–15.9)
Immature Grans (Abs): 0 10*3/uL (ref 0.0–0.1)
Immature Granulocytes: 0 %
Lymphocytes Absolute: 1.3 10*3/uL (ref 0.7–3.1)
Lymphs: 29 %
MCH: 27.2 pg (ref 26.6–33.0)
MCHC: 32.8 g/dL (ref 31.5–35.7)
MCV: 83 fL (ref 79–97)
Monocytes Absolute: 0.7 10*3/uL (ref 0.1–0.9)
Monocytes: 15 %
Neutrophils Absolute: 2.3 10*3/uL (ref 1.4–7.0)
Neutrophils: 53 %
Platelets: 203 10*3/uL (ref 150–450)
RBC: 4.82 x10E6/uL (ref 3.77–5.28)
RDW: 12.9 % (ref 11.7–15.4)
WBC: 4.4 10*3/uL (ref 3.4–10.8)

## 2022-01-26 LAB — CMP14+EGFR
ALT: 15 IU/L (ref 0–32)
AST: 24 IU/L (ref 0–40)
Albumin/Globulin Ratio: 1.4 (ref 1.2–2.2)
Albumin: 4.2 g/dL (ref 3.9–4.9)
Alkaline Phosphatase: 61 IU/L (ref 44–121)
BUN/Creatinine Ratio: 21 (ref 12–28)
BUN: 24 mg/dL (ref 8–27)
Bilirubin Total: 1 mg/dL (ref 0.0–1.2)
CO2: 24 mmol/L (ref 20–29)
Calcium: 9.8 mg/dL (ref 8.7–10.3)
Chloride: 101 mmol/L (ref 96–106)
Creatinine, Ser: 1.15 mg/dL — ABNORMAL HIGH (ref 0.57–1.00)
Globulin, Total: 3.1 g/dL (ref 1.5–4.5)
Glucose: 90 mg/dL (ref 70–99)
Potassium: 3.7 mmol/L (ref 3.5–5.2)
Sodium: 141 mmol/L (ref 134–144)
Total Protein: 7.3 g/dL (ref 6.0–8.5)
eGFR: 52 mL/min/{1.73_m2} — ABNORMAL LOW (ref >59)

## 2022-01-26 LAB — B12 AND FOLATE PANEL
Folate: 6.2 ng/mL (ref >3.0)
Vitamin B-12: 705 pg/mL (ref 232–1245)

## 2022-01-26 LAB — VITAMIN D 25 HYDROXY (VIT D DEFICIENCY, FRACTURES): Vit D, 25-Hydroxy: 30.9 ng/mL (ref 30.0–100.0)

## 2022-01-26 LAB — LIPID PANEL
Chol/HDL Ratio: 3.7 ratio (ref 0.0–4.4)
Cholesterol, Total: 217 mg/dL — ABNORMAL HIGH (ref 100–199)
HDL: 58 mg/dL (ref >39)
LDL Chol Calc (NIH): 151 mg/dL — ABNORMAL HIGH (ref 0–99)
Triglycerides: 47 mg/dL (ref 0–149)
VLDL Cholesterol Cal: 8 mg/dL (ref 5–40)

## 2022-01-26 LAB — TSH+FREE T4
Free T4: 1.49 ng/dL (ref 0.82–1.77)
TSH: 1.06 u[IU]/mL (ref 0.450–4.500)

## 2022-01-27 ENCOUNTER — Encounter: Payer: Self-pay | Admitting: Internal Medicine

## 2022-01-27 NOTE — Assessment & Plan Note (Signed)
Her blood pressure is significantly elevated today, 172/83 initially and 163/85 on repeat.  She has most recently been prescribed triamterene-HCTZ 37.5-25 mg daily for treatment of hypertension, but has not taken the medication in over a year.  She presented to her dentist office yesterday where her blood pressure was significantly elevated and she was instructed to follow-up with her PCP. -Restart triamterene-HCTZ today -Baseline labs been ordered -Follow-up in 4 weeks for HTN check

## 2022-02-18 ENCOUNTER — Ambulatory Visit: Payer: No Typology Code available for payment source | Admitting: Family Medicine

## 2022-03-02 ENCOUNTER — Encounter: Payer: Self-pay | Admitting: Family Medicine

## 2022-03-02 ENCOUNTER — Other Ambulatory Visit (HOSPITAL_COMMUNITY): Payer: Self-pay | Admitting: Family Medicine

## 2022-03-02 ENCOUNTER — Ambulatory Visit (INDEPENDENT_AMBULATORY_CARE_PROVIDER_SITE_OTHER): Payer: No Typology Code available for payment source | Admitting: Family Medicine

## 2022-03-02 VITALS — BP 176/88 | HR 71 | Ht 67.0 in | Wt 180.1 lb

## 2022-03-02 DIAGNOSIS — Z23 Encounter for immunization: Secondary | ICD-10-CM

## 2022-03-02 DIAGNOSIS — F29 Unspecified psychosis not due to a substance or known physiological condition: Secondary | ICD-10-CM | POA: Diagnosis not present

## 2022-03-02 DIAGNOSIS — Z1231 Encounter for screening mammogram for malignant neoplasm of breast: Secondary | ICD-10-CM

## 2022-03-02 DIAGNOSIS — E663 Overweight: Secondary | ICD-10-CM | POA: Diagnosis not present

## 2022-03-02 DIAGNOSIS — E785 Hyperlipidemia, unspecified: Secondary | ICD-10-CM

## 2022-03-02 DIAGNOSIS — I10 Essential (primary) hypertension: Secondary | ICD-10-CM

## 2022-03-02 MED ORDER — TRIAMTERENE-HCTZ 75-50 MG PO TABS: 1.0000 | ORAL_TABLET | Freq: Every day | ORAL | 3 refills | Status: DC

## 2022-03-02 NOTE — Patient Instructions (Signed)
Follow-up in 4 to 6 weeks, call if you need me sooner.  EKG at follow-up visit.  Flu vaccine in office today.  Please schedule mammogram at checkout  New higher dose of Maxide starting tomorrow please collect at the pharmacy today new dose is 75/50, one  tablet once daily.  Today in the office you are given an additional Maxide 25 mg tablet to equal the total dose.  Do not take any more of these  tablets and start your new medication tomorrow.  Very important that you take your medicine at the same time every day faithfully, for blood pressure control and protection of your brain and heart  Please work on food change as we discussed increasing natural foods vegetable and fruit and cut down on processed fried fatty foods and sugary foods and starches.  Thanks for choosing [facility], we consider it a privelige to serve you.

## 2022-03-07 ENCOUNTER — Encounter: Payer: Self-pay | Admitting: Family Medicine

## 2022-03-07 NOTE — Progress Notes (Signed)
Gina Black     MRN: [id number]      DOB: [birth year]   HPI Gina Black is here for follow up and re-evaluation of chronic medical conditions, medication management and review of any available recent lab and radiology data.  Daughter sent message after visit that dental work was deferred due to uncontrolled blood pressure. Mother continues to be non compliant and is impossible to get her to take meds as prescribed , daughter tries exceedingly hard to have her comply with meds and diet , always has been a struggle  ROS per pt feels well with no concerns Denies recent fever or chills. Denies sinus pressure, nasal congestion, ear pain or sore throat. Denies chest congestion, productive cough or wheezing. Denies chest pains, palpitations and leg swelling Denies abdominal pain, nausea, vomiting,diarrhea or constipation.   Denies dysuria, frequency, hesitancy or incontinence. Denies joint pain, swelling and limitation in mobility. Denies headaches, seizures, numbness, or tingling. Denies depression, anxiety or insomnia. Denies skin break down or rash.   PE  BP (!) 176/88   Pulse 71   Ht '5\' 7"'$  (1.702 m)   Wt 180 lb 1.9 oz (81.7 kg)   SpO2 97%   BMI 28.21 kg/m   Patient alert and oriented and in no cardiopulmonary distress.  HEENT: No facial asymmetry, EOMI,     Neck supple .  Chest: Clear to auscultation bilaterally.  CVS: S1, S2 no murmurs, no S3.Regular rate.  ABD: Soft non tender.   Ext: No edema  MS: Adequate ROM spine, shoulders, hips and knees.  Skin: Intact, no ulcerations or rash noted.  Psych: Good eye contact, blunted  affect. Memory intact not anxious or depressed appearing.  CNS: CN 2-12 intact, power,  normal throughout.no focal deficits noted.   Assessment & Plan  Essential hypertension Uncontrolled, compliance with treatment an ongoing challene and issue DASH diet and commitment to daily physical activity for a minimum of 30 minutes discussed  and encouraged, as a part of hypertension management. The importance of attaining a healthy weight is also discussed.     03/02/2022   11:11 AM 03/02/2022   10:52 AM 03/02/2022   10:49 AM 01/21/2022   11:20 AM 03/17/2020    1:23 PM 01/15/2020   10:36 AM 11/13/2019    1:11 PM  BP/Weight  Systolic BP 563 875 643 329  518 841  Diastolic BP 88 80 82 85  80 82  Wt. (Lbs)   180.12 177.8 170 170 177.04  BMI   28.21 kg/m2 27.85 kg/m2 26.63 kg/m2 26.63 kg/m2 27.73 kg/m2     Dose increased in maxzide  Overweight (BMI 25.0-29.9) Deteriorated  Patient re-educated about  the importance of commitment to a  minimum of 150 minutes of exercise per week as able.  The importance of healthy food choices with portion control discussed, as well as eating regularly and within a 12 hour window most days. The need to choose "clean , green" food 50 to 75% of the time is discussed, as well as to make water the primary drink and set a goal of 64 ounces water daily.       03/02/2022   10:49 AM 01/21/2022   11:20 AM 03/17/2020    1:23 PM  Weight /BMI  Weight 180 lb 1.9 oz 177 lb 12.8 oz 170 lb  Height '5\' 7"'$  (1.702 m) '5\' 7"'$  (1.702 m) '5\' 7"'$  (1.702 m)  BMI 28.21 kg/m2 27.85 kg/m2 26.63 kg/m2      Psychotic disorder  with delusions No report of hallucinations or potentially harmful behavior, except that her compliance with medications is a real challenge Daughter very supportive and does the best that sh can  Dyslipidemia Hyperlipidemia:Low fat diet discussed and encouraged.   Lipid Panel  Lab Results  Component Value Date   CHOL 217 (H) 01/25/2022   HDL 58 01/25/2022   LDLCALC 151 (H) 01/25/2022   TRIG 47 01/25/2022   CHOLHDL 3.7 01/25/2022     Needs to reduce fat in diet

## 2022-03-07 NOTE — Assessment & Plan Note (Signed)
Deteriorated  Patient re-educated about  the importance of commitment to a  minimum of 150 minutes of exercise per week as able.  The importance of healthy food choices with portion control discussed, as well as eating regularly and within a 12 hour window most days. The need to choose "clean , green" food 50 to 75% of the time is discussed, as well as to make water the primary drink and set a goal of 64 ounces water daily.       03/02/2022   10:49 AM 01/21/2022   11:20 AM 03/17/2020    1:23 PM  Weight /BMI  Weight 180 lb 1.9 oz 177 lb 12.8 oz 170 lb  Height '5\' 7"'$  (1.702 m) '5\' 7"'$  (1.702 m) '5\' 7"'$  (1.702 m)  BMI 28.21 kg/m2 27.85 kg/m2 26.63 kg/m2

## 2022-03-07 NOTE — Assessment & Plan Note (Addendum)
Uncontrolled, compliance with treatment an ongoing challene and issue DASH diet and commitment to daily physical activity for a minimum of 30 minutes discussed and encouraged, as a part of hypertension management. The importance of attaining a healthy weight is also discussed.     03/02/2022   11:11 AM 03/02/2022   10:52 AM 03/02/2022   10:49 AM 01/21/2022   11:20 AM 03/17/2020    1:23 PM 01/15/2020   10:36 AM 11/13/2019    1:11 PM  BP/Weight  Systolic BP 510 712 524 799  800 123  Diastolic BP 88 80 82 85  80 82  Wt. (Lbs)   180.12 177.8 170 170 177.04  BMI   28.21 kg/m2 27.85 kg/m2 26.63 kg/m2 26.63 kg/m2 27.73 kg/m2     Dose increased in maxzide

## 2022-03-07 NOTE — Assessment & Plan Note (Signed)
Hyperlipidemia:Low fat diet discussed and encouraged.   Lipid Panel  Lab Results  Component Value Date   CHOL 217 (H) 01/25/2022   HDL 58 01/25/2022   LDLCALC 151 (H) 01/25/2022   TRIG 47 01/25/2022   CHOLHDL 3.7 01/25/2022     Needs to reduce fat in diet

## 2022-03-07 NOTE — Assessment & Plan Note (Signed)
No report of hallucinations or potentially harmful behavior, except that her compliance with medications is a real challenge Daughter very supportive and does the best that sh can

## 2022-04-01 ENCOUNTER — Ambulatory Visit (INDEPENDENT_AMBULATORY_CARE_PROVIDER_SITE_OTHER): Payer: No Typology Code available for payment source | Admitting: Family Medicine

## 2022-04-01 ENCOUNTER — Encounter: Payer: Self-pay | Admitting: Family Medicine

## 2022-04-01 VITALS — BP 156/90 | HR 68 | Ht 67.0 in | Wt 182.0 lb

## 2022-04-01 DIAGNOSIS — I10 Essential (primary) hypertension: Secondary | ICD-10-CM

## 2022-04-01 MED ORDER — AMLODIPINE BESYLATE 5 MG PO TABS: 5.0000 mg | ORAL_TABLET | Freq: Every day | ORAL | 3 refills | Status: DC

## 2022-04-01 NOTE — Patient Instructions (Addendum)
F/U in 4 to 6 week re evaluate blood pressure  New additional med for B P is amlodipine 5 mg daily, take this along with the triamterene at the same time every day, one whole tablet of each, do NOT break tablets  Fasting chem 7 and EGFR 3 days before next appt  Think about what you will eat, plan ahead. Choose " clean, green, fresh or frozen" over canned, processed or packaged foods which are more sugary, salty and fatty. 70 to 75% of food eaten should be vegetables and fruit. Three meals at set times with snacks allowed between meals, but they must be fruit or vegetables. Aim to eat over a 12 hour period , example 7 am to 7 pm, and STOP after  your last meal of the day. Drink water,generally about 64 ounces per day, no other drink is as healthy. Fruit juice is best enjoyed in a healthy way, by EATING the fruit. Thanks for choosing [facility], we consider it a privelige to serve you.

## 2022-04-01 NOTE — Assessment & Plan Note (Addendum)
Uncontrolled but improved. Imporatnce of med adherence is stressed Add amlodipine 5 mg daily DASH diet and commitment to daily physical activity for a minimum of 30 minutes discussed and encouraged, as a part of hypertension management. The importance of attaining a healthy weight is also discussed.     04/01/2022   11:45 AM 04/01/2022   11:30 AM 04/01/2022   11:28 AM 03/02/2022   11:11 AM 03/02/2022   10:52 AM 03/02/2022   10:49 AM 01/21/2022   11:20 AM  BP/Weight  Systolic BP A999333 [id number] 0000000 [id number]  Diastolic BP 90 88 [id number] 88 80 82 85  Wt. (Lbs)   182.04   180.12 177.8  BMI   28.51 kg/m2   28.21 kg/m2 27.85 kg/m2

## 2022-04-01 NOTE — Progress Notes (Signed)
   Gina Black     MRN: [id number]      DOB: [birth year]   HPI Gina Black is here for follow up of uncontrolled blood pressure. Reports med compliance, however , of note she has a pill split in half, states she takes one most of the time but sometimes takes half Bottle nearly empty , as to be expected Denies adverse s/e from medication Understands no dental work will be done until bP iscontrolled ROS Denies recent fever or chills. Denies sinus pressure, nasal congestion, ear pain or sore throat. Denies chest congestion, productive cough or wheezing. Denies chest pains, palpitations and leg swelling Denies abdominal pain, nausea, vomiting,diarrhea or constipation.   Denies dysuria, frequency, hesitancy or incontinence. Denies joint pain, swelling and limitation in mobility.    PE  BP (!) 156/90   Pulse 68   Ht 5' 7"$  (1.702 m)   Wt 182 lb 0.6 oz (82.6 kg)   SpO2 98%   BMI 28.51 kg/m   Patient alert  and in no cardiopulmonary distress.  HEENT: No facial asymmetry, EOMI,     Neck supple .  Chest: Clear to auscultation bilaterally.  CVS: S1, S2 no murmurs, no S3.Regular rate.  ABD: Soft non tender.   Ext: No edema  MS: Adequate ROM spine, shoulders, hips and knees.  Skin: Intact, no ulcerations or rash noted.    CNS: CN 2-12 intact, power,  normal throughout.no focal deficits noted.   Assessment & Plan  Essential hypertension Uncontrolled but improved. Imporatnce of med adherence is stressed Add amlodipine 5 mg daily DASH diet and commitment to daily physical activity for a minimum of 30 minutes discussed and encouraged, as a part of hypertension management. The importance of attaining a healthy weight is also discussed.     04/01/2022   11:45 AM 04/01/2022   11:30 AM 04/01/2022   11:28 AM 03/02/2022   11:11 AM 03/02/2022   10:52 AM 03/02/2022   10:49 AM 01/21/2022   11:20 AM  BP/Weight  Systolic BP A999333 [id number] 0000000 [id number]  Diastolic BP 90 88 [id number]  88 80 82 85  Wt. (Lbs)   182.04   180.12 177.8  BMI   28.51 kg/m2   28.21 kg/m2 27.85 kg/m2

## 2022-04-16 ENCOUNTER — Encounter: Payer: Self-pay | Admitting: Family Medicine

## 2022-04-28 ENCOUNTER — Ambulatory Visit (HOSPITAL_COMMUNITY)
Admission: RE | Admit: 2022-04-28 | Discharge: 2022-04-28 | Disposition: A | Discharge status: Home / Self Care | Payer: No Typology Code available for payment source | Source: Ambulatory Visit | Attending: Family Medicine | Admitting: Family Medicine

## 2022-04-28 DIAGNOSIS — Z1231 Encounter for screening mammogram for malignant neoplasm of breast: Secondary | ICD-10-CM | POA: Insufficient documentation

## 2022-05-12 ENCOUNTER — Encounter: Payer: Self-pay | Admitting: Family Medicine

## 2022-05-12 ENCOUNTER — Ambulatory Visit (INDEPENDENT_AMBULATORY_CARE_PROVIDER_SITE_OTHER): Payer: No Typology Code available for payment source | Admitting: Family Medicine

## 2022-05-12 VITALS — BP 144/80 | HR 69 | Ht 67.0 in | Wt 180.1 lb

## 2022-05-12 DIAGNOSIS — E785 Hyperlipidemia, unspecified: Secondary | ICD-10-CM | POA: Diagnosis not present

## 2022-05-12 DIAGNOSIS — R7301 Impaired fasting glucose: Secondary | ICD-10-CM

## 2022-05-12 DIAGNOSIS — E663 Overweight: Secondary | ICD-10-CM | POA: Diagnosis not present

## 2022-05-12 DIAGNOSIS — R7302 Impaired glucose tolerance (oral): Secondary | ICD-10-CM

## 2022-05-12 DIAGNOSIS — I10 Essential (primary) hypertension: Secondary | ICD-10-CM | POA: Diagnosis not present

## 2022-05-12 MED ORDER — AMLODIPINE BESYLATE 5 MG PO TABS: 5.0000 mg | ORAL_TABLET | Freq: Every day | ORAL | 5 refills | Status: DC

## 2022-05-12 MED ORDER — TRIAMTERENE-HCTZ 75-50 MG PO TABS: 1.0000 | ORAL_TABLET | Freq: Every day | ORAL | 5 refills | Status: DC

## 2022-05-12 NOTE — Patient Instructions (Signed)
F/u in mid June , re evaluate blood pressure and labs  NEED to take BOTH blood pressure pills together , EVERY day at the same  time at 11 am, your blood pressure pills are  Amlodipine 5 mg  Triamterene 75/50  Increase natural foods, vegetables and fruit,   Reduce fried and fatty foods , chips, sweets  Fasting lipid, cmp and EGFR and HBA1c 3 to 5 days before June follow up, la b opens at 8 am , Monday through Friday  It is important that you exercise regularly at least 30 minutes 5 times a week. If you develop chest pain, have severe difficulty breathing, or feel very tired, stop exercising immediately and seek medical attention   Thanks for choosing [facility], we consider it a privelige to serve you.

## 2022-05-16 DIAGNOSIS — R7302 Impaired glucose tolerance (oral): Secondary | ICD-10-CM | POA: Insufficient documentation

## 2022-05-16 NOTE — Assessment & Plan Note (Signed)
Uncontrolled , her ability to follow through and be responsible  for her med management is questionable, I will reach out to daughter to see if she agreeable tonurse supervise med admin DASH diet and commitment to daily physical activity for a minimum of 30 minutes discussed and encouraged, as a part of hypertension management. The importance of attaining a healthy weight is also discussed.     05/12/2022   11:14 AM 05/12/2022   11:13 AM 05/12/2022   10:33 AM 05/12/2022   10:31 AM 04/01/2022   11:45 AM 04/01/2022   11:30 AM 04/01/2022   11:28 AM  BP/Weight  Systolic BP 144 150 160 158 156 170 163  Diastolic BP 80 80 75 102 90 88 107  Wt. (Lbs)    180.12   182.04  BMI    28.21 kg/m2   28.51 kg/m2

## 2022-05-16 NOTE — Assessment & Plan Note (Signed)
  Patient re-educated about  the importance of commitment to a  minimum of 150 minutes of exercise per week as able.  The importance of healthy food choices with portion control discussed, as well as eating regularly and within a 12 hour window most days. The need to choose "clean , green" food 50 to 75% of the time is discussed, as well as to make water the primary drink and set a goal of 64 ounces water daily.       05/12/2022   10:31 AM 04/01/2022   11:28 AM 03/02/2022   10:49 AM  Weight /BMI  Weight 180 lb 1.9 oz 182 lb 0.6 oz 180 lb 1.9 oz  Height 5\' 7"  (1.702 m) 5\' 7"  (1.702 m) 5\' 7"  (1.702 m)  BMI 28.21 kg/m2 28.51 kg/m2 28.21 kg/m2

## 2022-05-16 NOTE — Assessment & Plan Note (Signed)
Hyperlipidemia:Low fat diet discussed and encouraged.   Lipid Panel  Lab Results  Component Value Date   CHOL 217 (H) 01/25/2022   HDL 58 01/25/2022   LDLCALC 151 (H) 01/25/2022   TRIG 47 01/25/2022   CHOLHDL 3.7 01/25/2022     Updated lab needed at/ before next visit.

## 2022-05-16 NOTE — Progress Notes (Signed)
Gina Black     MRN: [id number]      DOB: [birth year]   HPI Gina Black is here for follow up and re-evaluation of cuncontrolled hypertension. Review of botles she has with her suggest non compliance , taking med irregularly, liklely not able to be responsiblefor med admin and needs  asistance Has needed and desred dental wotk ending until BP is corrected, so I believe she will comply with help   ROS Denies recent fever or chills. Denies sinus pressure, nasal congestion, ear pain or sore throat. Denies chest congestion, productive cough or wheezing. Denies chest pains, palpitations and leg swelling Denies abdominal pain, nausea, vomiting,diarrhea or constipation.   Denies dysuria, frequency, hesitancy or incontinence. Denies joint pain, swelling and limitation in mobility. Denies headaches, seizures, numbness, or tingling. Denies depression, anxiety or insomnia. Denies skin break down or rash.   PE  BP (!) 144/80   Pulse 69   Ht 5\' 7"  (1.702 m)   Wt 180 lb 1.9 oz (81.7 kg)   SpO2 96%   BMI 28.21 kg/m   Patient alert and oriented and in no cardiopulmonary distress.  HEENT: No facial asymmetry, EOMI,     Neck supple .  Chest: Clear to auscultation bilaterally.  CVS: S1, S2 no murmurs, no S3.Regular rate.  ABD: Soft non tender.   Ext: No edema  MS: Adequate ROM spine, shoulders, hips and knees.  Skin: Intact, no ulcerations or rash noted.  Psych: Good eye contact, normal affect. Memory intact not anxious or depressed appearing.  CNS: CN 2-12 intact, power,  normal throughout.no focal deficits noted.   Assessment & Plan  Essential hypertension Uncontrolled , her ability to follow through and be responsible  for her med management is questionable, I will reach out to daughter to see if she agreeable tonurse supervise med admin DASH diet and commitment to daily physical activity for a minimum of 30 minutes discussed and encouraged, as a part of hypertension  management. The importance of attaining a healthy weight is also discussed.     05/12/2022   11:14 AM 05/12/2022   11:13 AM 05/12/2022   10:33 AM 05/12/2022   10:31 AM 04/01/2022   11:45 AM 04/01/2022   11:30 AM 04/01/2022   11:28 AM  BP/Weight  Systolic BP 144 150 160 158 156 170 163  Diastolic BP 80 80 75 102 90 88 107  Wt. (Lbs)    180.12   182.04  BMI    28.21 kg/m2   28.51 kg/m2       Overweight (BMI 25.0-29.9)  Patient re-educated about  the importance of commitment to a  minimum of 150 minutes of exercise per week as able.  The importance of healthy food choices with portion control discussed, as well as eating regularly and within a 12 hour window most days. The need to choose "clean , green" food 50 to 75% of the time is discussed, as well as to make water the primary drink and set a goal of 64 ounces water daily.       05/12/2022   10:31 AM 04/01/2022   11:28 AM 03/02/2022   10:49 AM  Weight /BMI  Weight 180 lb 1.9 oz 182 lb 0.6 oz 180 lb 1.9 oz  Height 5\' 7"  (1.702 m) 5\' 7"  (1.702 m) 5\' 7"  (1.702 m)  BMI 28.21 kg/m2 28.51 kg/m2 28.21 kg/m2      Dyslipidemia Hyperlipidemia:Low fat diet discussed and encouraged.   Lipid Panel  Lab Results  Component Value Date   CHOL 217 (H) 01/25/2022   HDL 58 01/25/2022   LDLCALC 151 (H) 01/25/2022   TRIG 47 01/25/2022   CHOLHDL 3.7 01/25/2022     Updated lab needed at/ before next visit.   IGT (impaired glucose tolerance) Patient educated about the importance of limiting  Carbohydrate intake , the need to commit to daily physical activity for a minimum of 30 minutes , and to commit weight loss. The fact that changes in all these areas will reduce or eliminate all together the development of diabetes is stressed.      Latest Ref Rng & Units 01/25/2022    8:59 AM 04/11/2019    3:39 PM 03/18/2018   11:06 AM 10/15/2017   10:46 AM 09/23/2016   10:25 AM  Diabetic Labs  HbA1c 4.8 - 5.6 % 5.8       Chol 100 - 199 mg/dL 242  353   614  431  540   HDL >39 mg/dL 58  70  70  67  70   Calc LDL 0 - 99 mg/dL 086  761  950  932  671   Triglycerides 0 - 149 mg/dL 47  47  49  44  43   Creatinine 0.57 - 1.00 mg/dL 2.45  8.09  9.83  3.82  0.91       05/12/2022   11:14 AM 05/12/2022   11:13 AM 05/12/2022   10:33 AM 05/12/2022   10:31 AM 04/01/2022   11:45 AM 04/01/2022   11:30 AM 04/01/2022   11:28 AM  BP/Weight  Systolic BP 144 150 160 158 156 170 163  Diastolic BP 80 80 75 102 90 88 107  Wt. (Lbs)    180.12   182.04  BMI    28.21 kg/m2   28.51 kg/m2       No data to display          Updated lab needed at/ before next visit.

## 2022-05-16 NOTE — Assessment & Plan Note (Signed)
Patient educated about the importance of limiting  Carbohydrate intake , the need to commit to daily physical activity for a minimum of 30 minutes , and to commit weight loss. The fact that changes in all these areas will reduce or eliminate all together the development of diabetes is stressed.      Latest Ref Rng & Units 01/25/2022    8:59 AM 04/11/2019    3:39 PM 03/18/2018   11:06 AM 10/15/2017   10:46 AM 09/23/2016   10:25 AM  Diabetic Labs  HbA1c 4.8 - 5.6 % 5.8       Chol 100 - 199 mg/dL 202  334  356  861  683   HDL >39 mg/dL 58  70  70  67  70   Calc LDL 0 - 99 mg/dL 729  021  115  520  802   Triglycerides 0 - 149 mg/dL 47  47  49  44  43   Creatinine 0.57 - 1.00 mg/dL 2.33  6.12  2.44  9.75  0.91       05/12/2022   11:14 AM 05/12/2022   11:13 AM 05/12/2022   10:33 AM 05/12/2022   10:31 AM 04/01/2022   11:45 AM 04/01/2022   11:30 AM 04/01/2022   11:28 AM  BP/Weight  Systolic BP 144 150 160 158 156 170 163  Diastolic BP 80 80 75 102 90 88 107  Wt. (Lbs)    180.12   182.04  BMI    28.21 kg/m2   28.51 kg/m2       No data to display          Updated lab needed at/ before next visit.

## 2022-05-25 ENCOUNTER — Ambulatory Visit: Payer: No Typology Code available for payment source | Admitting: Family Medicine

## 2022-07-26 DIAGNOSIS — E785 Hyperlipidemia, unspecified: Secondary | ICD-10-CM | POA: Diagnosis not present

## 2022-07-26 DIAGNOSIS — I10 Essential (primary) hypertension: Secondary | ICD-10-CM | POA: Diagnosis not present

## 2022-07-26 DIAGNOSIS — R7301 Impaired fasting glucose: Secondary | ICD-10-CM | POA: Diagnosis not present

## 2022-07-27 ENCOUNTER — Other Ambulatory Visit: Payer: Self-pay | Admitting: Family Medicine

## 2022-07-27 LAB — LIPID PANEL
Chol/HDL Ratio: 2.9 ratio (ref 0.0–4.4)
Cholesterol, Total: 186 mg/dL (ref 100–199)
HDL: 64 mg/dL (ref >39)
LDL Chol Calc (NIH): 114 mg/dL — ABNORMAL HIGH (ref 0–99)
Triglycerides: 42 mg/dL (ref 0–149)
VLDL Cholesterol Cal: 8 mg/dL (ref 5–40)

## 2022-07-27 LAB — CMP14+EGFR
ALT: 17 IU/L (ref 0–32)
AST: 27 IU/L (ref 0–40)
Albumin: 4.1 g/dL (ref 3.9–4.9)
Alkaline Phosphatase: 50 IU/L (ref 44–121)
BUN/Creatinine Ratio: 23 (ref 12–28)
BUN: 23 mg/dL (ref 8–27)
Bilirubin Total: 0.4 mg/dL (ref 0.0–1.2)
CO2: 27 mmol/L (ref 20–29)
Calcium: 9.6 mg/dL (ref 8.7–10.3)
Chloride: 103 mmol/L (ref 96–106)
Creatinine, Ser: 0.99 mg/dL (ref 0.57–1.00)
Globulin, Total: 2.9 g/dL (ref 1.5–4.5)
Glucose: 87 mg/dL (ref 70–99)
Potassium: 3.2 mmol/L — ABNORMAL LOW (ref 3.5–5.2)
Sodium: 143 mmol/L (ref 134–144)
Total Protein: 7 g/dL (ref 6.0–8.5)
eGFR: 62 mL/min/{1.73_m2} (ref >59)

## 2022-07-27 LAB — HEMOGLOBIN A1C
Est. average glucose Bld gHb Est-mCnc: 117 mg/dL
Hgb A1c MFr Bld: 5.7 % — ABNORMAL HIGH (ref 4.8–5.6)

## 2022-07-27 MED ORDER — POTASSIUM CHLORIDE ER 10 MEQ PO TBCR: 10.0000 meq | EXTENDED_RELEASE_TABLET | Freq: Every day | ORAL | 0 refills | Status: DC

## 2022-07-29 ENCOUNTER — Encounter: Payer: Self-pay | Admitting: Family Medicine

## 2022-07-30 ENCOUNTER — Encounter: Payer: Self-pay | Admitting: Family Medicine

## 2022-07-30 ENCOUNTER — Ambulatory Visit (INDEPENDENT_AMBULATORY_CARE_PROVIDER_SITE_OTHER): Payer: No Typology Code available for payment source | Admitting: Family Medicine

## 2022-07-30 VITALS — BP 147/72 | HR 67 | Ht 67.0 in | Wt 181.1 lb

## 2022-07-30 DIAGNOSIS — R7302 Impaired glucose tolerance (oral): Secondary | ICD-10-CM

## 2022-07-30 DIAGNOSIS — I10 Essential (primary) hypertension: Secondary | ICD-10-CM

## 2022-07-30 DIAGNOSIS — R011 Cardiac murmur, unspecified: Secondary | ICD-10-CM

## 2022-07-30 DIAGNOSIS — E785 Hyperlipidemia, unspecified: Secondary | ICD-10-CM

## 2022-07-30 DIAGNOSIS — E663 Overweight: Secondary | ICD-10-CM | POA: Diagnosis not present

## 2022-07-30 MED ORDER — AMLODIPINE BESYLATE 2.5 MG PO TABS
2.5000 mg | ORAL_TABLET | Freq: Every day | ORAL | 3 refills | Status: DC
Start: 2022-07-30 — End: 2022-10-14

## 2022-07-30 NOTE — Patient Instructions (Addendum)
Annual exam with EKG in 6 weeks , also re evaluate blood pressure  New additional tablet for BP is amlodipine 2.5 mg one daily,( total dose of 7.5 mg daily)  continue amlodpine 5 mg one daily and triamterene 75/50 daily  You are referred for echocardiogram    New is potassium 10 meq one daily since potassium was slightly low  Please no pork products , canned foods and sodas as these are high in sodium and will push up your blood pressure

## 2022-07-31 ENCOUNTER — Encounter: Payer: Self-pay | Admitting: Family Medicine

## 2022-07-31 DIAGNOSIS — R011 Cardiac murmur, unspecified: Secondary | ICD-10-CM | POA: Insufficient documentation

## 2022-07-31 NOTE — Assessment & Plan Note (Signed)
Patient educated about the importance of limiting  Carbohydrate intake , the need to commit to daily physical activity for a minimum of 30 minutes , and to commit weight loss. The fact that changes in all these areas will reduce or eliminate all together the development of diabetes is stressed.  Improved which is great     Latest Ref Rng & Units 07/26/2022    9:13 AM 01/25/2022    8:59 AM 04/11/2019    3:39 PM 03/18/2018   11:06 AM 10/15/2017   10:46 AM  Diabetic Labs  HbA1c 4.8 - 5.6 % 5.7  5.8      Chol 100 - 199 mg/dL 161  096  045  409  811   HDL >39 mg/dL 64  58  70  70  67   Calc LDL 0 - 99 mg/dL 914  782  956  213  086   Triglycerides 0 - 149 mg/dL 42  47  47  49  44   Creatinine 0.57 - 1.00 mg/dL 5.78  4.69  6.29  5.28  0.91       07/30/2022   11:39 AM 07/30/2022   10:59 AM 05/12/2022   11:14 AM 05/12/2022   11:13 AM 05/12/2022   10:33 AM 05/12/2022   10:31 AM 04/01/2022   11:45 AM  BP/Weight  Systolic BP 147 147 144 150 160 158 156  Diastolic BP 72 74 80 80 75 102 90  Wt. (Lbs)  181.08    180.12   BMI  28.36 kg/m2    28.21 kg/m2        No data to display

## 2022-07-31 NOTE — Assessment & Plan Note (Signed)
Hyperlipidemia:Low fat diet discussed and encouraged.   Lipid Panel  Lab Results  Component Value Date   CHOL 186 07/26/2022   HDL 64 07/26/2022   LDLCALC 114 (H) 07/26/2022   TRIG 42 07/26/2022   CHOLHDL 2.9 07/26/2022     Improving , which is great

## 2022-07-31 NOTE — Assessment & Plan Note (Signed)
Referred for echo to further eval

## 2022-07-31 NOTE — Progress Notes (Signed)
Gina Black     MRN: [id number]      DOB: 09-06-1955  Chief Complaint  Patient presents with   Hypertension    Pt following up.     HPI Ms. Back is here for follow up and re-evaluation of chronic medical conditions, in particular uncontrolled hypertension,medication management and review of any available recent lab and radiology data.  Preventive health is updated, specifically  Cancer screening and Immunization.  Needs vaccines will message daughter  Has med bottles with her and has been consistent in taking meds as prescribed it seems Concern sent by daughter that she continues to have poor food choice as in salty foods including pork, and hs e has asked that diet change be stressed at visit Still awaiting dental work , needs BP controlled  ROS Denies recent fever or chills. Denies sinus pressure, nasal congestion, ear pain or sore throat. Denies chest congestion, productive cough or wheezing. Denies chest pains, palpitations and leg swelling Denies abdominal pain, nausea, vomiting,diarrhea or constipation.   Denies dysuria, frequency, hesitancy or incontinence. Denies joint pain, swelling and limitation in mobility. Denies headaches, seizures, numbness, or tingling. Denies depression, anxiety or insomnia. Denies skin break down or rash.   PE  BP (!) 147/72   Pulse 67   Ht 5\' 7"  (1.702 m)   Wt 181 lb 1.3 oz (82.1 kg)   SpO2 97%   BMI 28.36 kg/m   Patient alert and oriented and in no cardiopulmonary distress.  HEENT: No facial asymmetry, EOMI,     Neck supple .  Chest: Clear to auscultation bilaterally.  CVS: S1, S2 systolic murmurs, no S3.Regular rate.  ABD: Soft non tender.   Ext: No edema  MS: Adequate ROM spine, shoulders, hips and knees.  Skin: Intact, no ulcerations or rash noted.  Psych: Good eye contact, normal affect.  not anxious or depressed appearing.  CNS: CN 2-12 intact, power,  normal throughout.no focal deficits  noted.   Assessment & Plan  Essential hypertension Uncontrolled , dose increase to amlodipine 7.5 mg daily EKG at next visit if has not seen Cardiology  DASH diet and commitment to daily physical activity for a minimum of 30 minutes discussed and encouraged, as a part of hypertension management. The importance of attaining a healthy weight is also discussed.     07/30/2022   11:39 AM 07/30/2022   10:59 AM 05/12/2022   11:14 AM 05/12/2022   11:13 AM 05/12/2022   10:33 AM 05/12/2022   10:31 AM 04/01/2022   11:45 AM  BP/Weight  Systolic BP 147 147 144 150 160 158 156  Diastolic BP 72 74 80 80 75 102 90  Wt. (Lbs)  181.08    180.12   BMI  28.36 kg/m2    28.21 kg/m2      'stop salted meats and canned foods, increase vegetables and fruit fresh and frozen  IGT (impaired glucose tolerance) Patient educated about the importance of limiting  Carbohydrate intake , the need to commit to daily physical activity for a minimum of 30 minutes , and to commit weight loss. The fact that changes in all these areas will reduce or eliminate all together the development of diabetes is stressed.  Improved which is great     Latest Ref Rng & Units 07/26/2022    9:13 AM 01/25/2022    8:59 AM 04/11/2019    3:39 PM 03/18/2018   11:06 AM 10/15/2017   10:46 AM  Diabetic Labs  HbA1c 4.8 -  5.6 % 5.7  5.8      Chol 100 - 199 mg/dL 409  811  914  782  956   HDL >39 mg/dL 64  58  70  70  67   Calc LDL 0 - 99 mg/dL 213  086  578  469  629   Triglycerides 0 - 149 mg/dL 42  47  47  49  44   Creatinine 0.57 - 1.00 mg/dL 5.28  4.13  2.44  0.10  0.91       07/30/2022   11:39 AM 07/30/2022   10:59 AM 05/12/2022   11:14 AM 05/12/2022   11:13 AM 05/12/2022   10:33 AM 05/12/2022   10:31 AM 04/01/2022   11:45 AM  BP/Weight  Systolic BP 147 147 144 150 160 158 156  Diastolic BP 72 74 80 80 75 102 90  Wt. (Lbs)  181.08    180.12   BMI  28.36 kg/m2    28.21 kg/m2        No data to display             Dyslipidemia Hyperlipidemia:Low fat diet discussed and encouraged.   Lipid Panel  Lab Results  Component Value Date   CHOL 186 07/26/2022   HDL 64 07/26/2022   LDLCALC 114 (H) 07/26/2022   TRIG 42 07/26/2022   CHOLHDL 2.9 07/26/2022     Improving , which is great  Overweight (BMI 25.0-29.9)  Patient re-educated about  the importance of commitment to a  minimum of 150 minutes of exercise per week as able.  The importance of healthy food choices with portion control discussed, as well as eating regularly and within a 12 hour window most days. The need to choose "clean , green" food 50 to 75% of the time is discussed, as well as to make water the primary drink and set a goal of 64 ounces water daily.       07/30/2022   10:59 AM 05/12/2022   10:31 AM 04/01/2022   11:28 AM  Weight /BMI  Weight 181 lb 1.3 oz 180 lb 1.9 oz 182 lb 0.6 oz  Height 5\' 7"  (1.702 m) 5\' 7"  (1.702 m) 5\' 7"  (1.702 m)  BMI 28.36 kg/m2 28.21 kg/m2 28.51 kg/m2    Unchanged  Heart murmur Referred for echo to further eval

## 2022-07-31 NOTE — Assessment & Plan Note (Signed)
  Patient re-educated about  the importance of commitment to a  minimum of 150 minutes of exercise per week as able.  The importance of healthy food choices with portion control discussed, as well as eating regularly and within a 12 hour window most days. The need to choose "clean , green" food 50 to 75% of the time is discussed, as well as to make water the primary drink and set a goal of 64 ounces water daily.       07/30/2022   10:59 AM 05/12/2022   10:31 AM 04/01/2022   11:28 AM  Weight /BMI  Weight 181 lb 1.3 oz 180 lb 1.9 oz 182 lb 0.6 oz  Height 5\' 7"  (1.702 m) 5\' 7"  (1.702 m) 5\' 7"  (1.702 m)  BMI 28.36 kg/m2 28.21 kg/m2 28.51 kg/m2    Unchanged

## 2022-07-31 NOTE — Assessment & Plan Note (Addendum)
Uncontrolled , dose increase to amlodipine 7.5 mg daily EKG at next visit if has not seen Cardiology  DASH diet and commitment to daily physical activity for a minimum of 30 minutes discussed and encouraged, as a part of hypertension management. The importance of attaining a healthy weight is also discussed.     07/30/2022   11:39 AM 07/30/2022   10:59 AM 05/12/2022   11:14 AM 05/12/2022   11:13 AM 05/12/2022   10:33 AM 05/12/2022   10:31 AM 04/01/2022   11:45 AM  BP/Weight  Systolic BP 147 147 144 150 160 158 156  Diastolic BP 72 74 80 80 75 102 90  Wt. (Lbs)  181.08    180.12   BMI  28.36 kg/m2    28.21 kg/m2      'stop salted meats and canned foods, increase vegetables and fruit fresh and frozen

## 2022-08-18 ENCOUNTER — Other Ambulatory Visit: Payer: Self-pay | Admitting: Family Medicine

## 2022-09-03 ENCOUNTER — Other Ambulatory Visit: Payer: Self-pay | Admitting: Family Medicine

## 2022-09-10 ENCOUNTER — Encounter: Payer: No Typology Code available for payment source | Admitting: Family Medicine

## 2022-09-27 ENCOUNTER — Other Ambulatory Visit: Payer: Self-pay | Admitting: Family Medicine

## 2022-10-12 ENCOUNTER — Encounter: Payer: Self-pay | Admitting: Pharmacist

## 2022-10-12 ENCOUNTER — Other Ambulatory Visit: Payer: Self-pay | Admitting: Family Medicine

## 2022-10-14 ENCOUNTER — Encounter: Payer: Self-pay | Admitting: Family Medicine

## 2022-10-14 ENCOUNTER — Encounter: Payer: Self-pay | Admitting: *Deleted

## 2022-10-14 ENCOUNTER — Ambulatory Visit (INDEPENDENT_AMBULATORY_CARE_PROVIDER_SITE_OTHER): Payer: No Typology Code available for payment source | Admitting: Family Medicine

## 2022-10-14 VITALS — BP 152/78 | HR 73 | Ht 67.0 in | Wt 176.1 lb

## 2022-10-14 DIAGNOSIS — Z0001 Encounter for general adult medical examination with abnormal findings: Secondary | ICD-10-CM | POA: Diagnosis not present

## 2022-10-14 DIAGNOSIS — R7301 Impaired fasting glucose: Secondary | ICD-10-CM | POA: Diagnosis not present

## 2022-10-14 DIAGNOSIS — E559 Vitamin D deficiency, unspecified: Secondary | ICD-10-CM | POA: Diagnosis not present

## 2022-10-14 DIAGNOSIS — R011 Cardiac murmur, unspecified: Secondary | ICD-10-CM

## 2022-10-14 DIAGNOSIS — R7302 Impaired glucose tolerance (oral): Secondary | ICD-10-CM

## 2022-10-14 DIAGNOSIS — R9431 Abnormal electrocardiogram [ECG] [EKG]: Secondary | ICD-10-CM | POA: Diagnosis not present

## 2022-10-14 DIAGNOSIS — I10 Essential (primary) hypertension: Secondary | ICD-10-CM | POA: Diagnosis not present

## 2022-10-14 DIAGNOSIS — Z23 Encounter for immunization: Secondary | ICD-10-CM

## 2022-10-14 DIAGNOSIS — D126 Benign neoplasm of colon, unspecified: Secondary | ICD-10-CM

## 2022-10-14 DIAGNOSIS — E785 Hyperlipidemia, unspecified: Secondary | ICD-10-CM

## 2022-10-14 MED ORDER — AMLODIPINE BESYLATE 10 MG PO TABS: 10.0000 mg | ORAL_TABLET | Freq: Every day | ORAL | 3 refills | Status: DC

## 2022-10-14 NOTE — Patient Instructions (Addendum)
F/u in 6 to 8 weeks , re evaluate bP  EKG today  Flu vaccine today  Pls get covid vaccine in next 1 to 2 weeks  Please start shingrix vaccine in October   You are referred for colonoscopy, due end December  New higher dose amlodipine 10 mg one daily, stop amlodipine 5mg  and 2.5 mg tablets  Fasting CBC, lipid, cmp and EGFr, TSH, Vit D and hBA1C 3 to 5 days before next visit  Thanks for choosing [facility], we consider it a privelige to serve you.

## 2022-10-18 ENCOUNTER — Encounter: Payer: Self-pay | Admitting: Family Medicine

## 2022-10-18 DIAGNOSIS — R9431 Abnormal electrocardiogram [ECG] [EKG]: Secondary | ICD-10-CM | POA: Insufficient documentation

## 2022-10-18 NOTE — Assessment & Plan Note (Signed)
Uncontrolle , new is amlodipine 10 mg daily DASH diet and commitment to daily physical activity for a minimum of 30 minutes discussed and encouraged, as a part of hypertension management. The importance of attaining a healthy weight is also discussed.     10/14/2022   10:14 AM 07/30/2022   11:39 AM 07/30/2022   10:59 AM 05/12/2022   11:14 AM 05/12/2022   11:13 AM 05/12/2022   10:33 AM 05/12/2022   10:31 AM  BP/Weight  Systolic BP 152 147 147 144 150 160 158  Diastolic BP 78 72 74 80 80 75 536  Wt. (Lbs) 176.12  181.08    180.12  BMI 27.58 kg/m2  28.36 kg/m2    28.21 kg/m2

## 2022-10-18 NOTE — Assessment & Plan Note (Signed)
Hyperlipidemia:Low fat diet discussed and encouraged.   Lipid Panel  Lab Results  Component Value Date   CHOL 186 07/26/2022   HDL 64 07/26/2022   LDLCALC 114 (H) 07/26/2022   TRIG 42 07/26/2022   CHOLHDL 2.9 07/26/2022     Improving  Updated lab needed at/ before next visit.

## 2022-10-18 NOTE — Assessment & Plan Note (Signed)
Annual exam as documented. Counseling done  re healthy lifestyle involving commitment to 150 minutes exercise per week, heart healthy diet, and attaining healthy weight.The importance of adequate sleep also discussed.  Immunization and cancer screening needs are specifically addressed at this visit.  

## 2022-10-18 NOTE — Assessment & Plan Note (Signed)
Needs further evaluation by cardiology

## 2022-10-18 NOTE — Assessment & Plan Note (Signed)
Improved,  Updated lab needed at/ before next visit.

## 2022-10-18 NOTE — Progress Notes (Signed)
Gina Black     MRN: [id number]      DOB: [birth year]  Chief Complaint  Patient presents with   Annual Exam    W/ EKG and B/P F/U    Immunizations    Pt. Would like to receive Pneumonia, Shingles and Flu vaccinations today in office.     HPI: Patient is in for annual physical exam. No other health concerns are expressed or addressed at the visit. Recent labs,  are reviewed. Immunization is reviewed , and  updated if needed.   PE: BP (!) 152/78 (BP Location: Right Arm, Patient Position: Sitting, Cuff Size: Large)   Pulse 73   Ht 5\' 7"  (1.702 m)   Wt 176 lb 1.9 oz (79.9 kg)   SpO2 93%   BMI 27.58 kg/m   Pleasant  female, alert and oriented x 3, in no cardio-pulmonary distress. Afebrile. HEENT No facial trauma or asymetry. Sinuses non tender.  Extra occullar muscles intact.. External ears normal, . Neck: supple, no adenopathy,JVD or thyromegaly.No bruits.  Chest: Clear to ascultation bilaterally.No crackles or wheezes. Non tender to palpation  Breast: Not examined   Cardiovascular system; Heart sounds normal,  S1 and  S2 ,no S3.  Systolic  murmur, or thrill. Apical beat not displaced Peripheral pulses normal. EKG: Non specific t wave abn  Abdomen: Soft, non tender, no organomegaly or masses. No bruits. Bowel sounds normal. No guarding, tenderness or rebound.     Musculoskeletal exam: Full ROM of spine, hips , shoulders and knees. No deformity ,swelling or crepitus noted. No muscle wasting or atrophy.   Neurologic: Cranial nerves 2 to 12 intact. Power, tone ,sensation  normal throughout. No disturbance in gait. No tremor.  Skin: Intact, no ulceration, erythema , scaling or rash noted. Pigmentation normal throughout  Psych; Normal mood and  flat affect.   Assessment & Plan:  Encounter for Medicare annual examination with abnormal findings Annual exam as documented. Counseling done  re healthy lifestyle involving commitment to 150  minutes exercise per week, heart healthy diet, and attaining healthy weight.The importance of adequate sleep also discussed.  Immunization and cancer screening needs are specifically addressed at this visit.   Essential hypertension Uncontrolle , new is amlodipine 10 mg daily DASH diet and commitment to daily physical activity for a minimum of 30 minutes discussed and encouraged, as a part of hypertension management. The importance of attaining a healthy weight is also discussed.     10/14/2022   10:14 AM 07/30/2022   11:39 AM 07/30/2022   10:59 AM 05/12/2022   11:14 AM 05/12/2022   11:13 AM 05/12/2022   10:33 AM 05/12/2022   10:31 AM  BP/Weight  Systolic BP 152 147 147 144 150 160 158  Diastolic BP 78 72 74 80 80 75 409  Wt. (Lbs) 176.12  181.08    180.12  BMI 27.58 kg/m2  28.36 kg/m2    28.21 kg/m2       Heart murmur Needs further evaluation by cardiology  Abnormal EKG Non specific st , will refer to cardiology  IGT (impaired glucose tolerance) Improved,  Updated lab needed at/ before next visit.   Dyslipidemia Hyperlipidemia:Low fat diet discussed and encouraged.   Lipid Panel  Lab Results  Component Value Date   CHOL 186 07/26/2022   HDL 64 07/26/2022   LDLCALC 114 (H) 07/26/2022   TRIG 42 07/26/2022   CHOLHDL 2.9 07/26/2022     Improving  Updated lab needed at/ before next visit.

## 2022-10-18 NOTE — Assessment & Plan Note (Signed)
Non specific st , will refer to cardiology

## 2022-10-29 ENCOUNTER — Other Ambulatory Visit: Payer: Self-pay | Admitting: Family Medicine

## 2022-12-08 ENCOUNTER — Other Ambulatory Visit: Payer: Self-pay

## 2022-12-08 DIAGNOSIS — R7302 Impaired glucose tolerance (oral): Secondary | ICD-10-CM | POA: Diagnosis not present

## 2022-12-08 DIAGNOSIS — E785 Hyperlipidemia, unspecified: Secondary | ICD-10-CM | POA: Diagnosis not present

## 2022-12-08 DIAGNOSIS — I10 Essential (primary) hypertension: Secondary | ICD-10-CM | POA: Diagnosis not present

## 2022-12-08 DIAGNOSIS — E559 Vitamin D deficiency, unspecified: Secondary | ICD-10-CM | POA: Diagnosis not present

## 2022-12-08 MED ORDER — POTASSIUM CHLORIDE ER 10 MEQ PO TBCR: 10.0000 meq | EXTENDED_RELEASE_TABLET | Freq: Every day | ORAL | 0 refills | Status: DC

## 2022-12-09 ENCOUNTER — Encounter: Payer: Self-pay | Admitting: Family Medicine

## 2022-12-09 LAB — CBC
Hematocrit: 39.4 % (ref 34.0–46.6)
Hemoglobin: 12.6 g/dL (ref 11.1–15.9)
MCH: 27.5 pg (ref 26.6–33.0)
MCHC: 32 g/dL (ref 31.5–35.7)
MCV: 86 fL (ref 79–97)
Platelets: 211 10*3/uL (ref 150–450)
RBC: 4.59 x10E6/uL (ref 3.77–5.28)
RDW: 13.1 % (ref 11.7–15.4)
WBC: 5.7 10*3/uL (ref 3.4–10.8)

## 2022-12-09 LAB — CMP14+EGFR
ALT: 18 [IU]/L (ref 0–32)
AST: 24 [IU]/L (ref 0–40)
Albumin: 4.3 g/dL (ref 3.9–4.9)
Alkaline Phosphatase: 47 [IU]/L (ref 44–121)
BUN/Creatinine Ratio: 25 (ref 12–28)
BUN: 27 mg/dL (ref 8–27)
Bilirubin Total: 0.6 mg/dL (ref 0.0–1.2)
CO2: 28 mmol/L (ref 20–29)
Calcium: 10.1 mg/dL (ref 8.7–10.3)
Chloride: 102 mmol/L (ref 96–106)
Creatinine, Ser: 1.06 mg/dL — ABNORMAL HIGH (ref 0.57–1.00)
Globulin, Total: 3.2 g/dL (ref 1.5–4.5)
Glucose: 88 mg/dL (ref 70–99)
Potassium: 3 mmol/L — ABNORMAL LOW (ref 3.5–5.2)
Sodium: 145 mmol/L — ABNORMAL HIGH (ref 134–144)
Total Protein: 7.5 g/dL (ref 6.0–8.5)
eGFR: 57 mL/min/{1.73_m2} — ABNORMAL LOW (ref >59)

## 2022-12-09 LAB — VITAMIN D 25 HYDROXY (VIT D DEFICIENCY, FRACTURES): Vit D, 25-Hydroxy: 38.5 ng/mL (ref 30.0–100.0)

## 2022-12-09 LAB — LIPID PANEL
Chol/HDL Ratio: 3.1 {ratio} (ref 0.0–4.4)
Cholesterol, Total: 210 mg/dL — ABNORMAL HIGH (ref 100–199)
HDL: 67 mg/dL (ref >39)
LDL Chol Calc (NIH): 133 mg/dL — ABNORMAL HIGH (ref 0–99)
Triglycerides: 56 mg/dL (ref 0–149)
VLDL Cholesterol Cal: 10 mg/dL (ref 5–40)

## 2022-12-09 LAB — HEMOGLOBIN A1C
Est. average glucose Bld gHb Est-mCnc: 123 mg/dL
Hgb A1c MFr Bld: 5.9 % — ABNORMAL HIGH (ref 4.8–5.6)

## 2022-12-09 LAB — TSH: TSH: 1.35 u[IU]/mL (ref 0.450–4.500)

## 2022-12-09 MED ORDER — POTASSIUM CHLORIDE CRYS ER 20 MEQ PO TBCR: EXTENDED_RELEASE_TABLET | ORAL | 3 refills | Status: DC

## 2022-12-09 NOTE — Addendum Note (Signed)
Addended by: Syliva Overman E on: 12/09/2022 12:45 PM   Modules accepted: Orders

## 2022-12-10 ENCOUNTER — Encounter: Payer: Self-pay | Admitting: Family Medicine

## 2022-12-10 ENCOUNTER — Ambulatory Visit (INDEPENDENT_AMBULATORY_CARE_PROVIDER_SITE_OTHER): Payer: No Typology Code available for payment source | Admitting: Family Medicine

## 2022-12-10 VITALS — BP 140/82 | HR 70 | Ht 67.0 in | Wt 176.1 lb

## 2022-12-10 DIAGNOSIS — Z23 Encounter for immunization: Secondary | ICD-10-CM | POA: Diagnosis not present

## 2022-12-10 DIAGNOSIS — E785 Hyperlipidemia, unspecified: Secondary | ICD-10-CM

## 2022-12-10 DIAGNOSIS — I10 Essential (primary) hypertension: Secondary | ICD-10-CM

## 2022-12-10 DIAGNOSIS — R7302 Impaired glucose tolerance (oral): Secondary | ICD-10-CM | POA: Diagnosis not present

## 2022-12-10 DIAGNOSIS — L819 Disorder of pigmentation, unspecified: Secondary | ICD-10-CM | POA: Diagnosis not present

## 2022-12-10 MED ORDER — ROSUVASTATIN CALCIUM 5 MG PO TABS: 5.0000 mg | ORAL_TABLET | Freq: Every day | ORAL | 3 refills | Status: DC

## 2022-12-10 MED ORDER — TRIAMTERENE-HCTZ 75-50 MG PO TABS: 1.0000 | ORAL_TABLET | Freq: Every day | ORAL | 5 refills | Status: DC

## 2022-12-10 MED ORDER — CLONIDINE HCL 0.1 MG PO TABS: 0.1000 mg | ORAL_TABLET | Freq: Every day | ORAL | 3 refills | Status: DC

## 2022-12-10 NOTE — Patient Instructions (Signed)
Follow-up second week in January, call if you need me sooner.  Pneumonia 20 vaccine in office today.  Nurse please call and document the COVID-vaccine which patient received in September of this year.  New additional medication for blood pressure is clonidine 0.1 mg 1 at bedtime.  Continue amlodipine and triamterene as before.  Your blood pressure has improved but is not yet at goal.  Cholesterol has increased.  I recommend low-dose Crestor rosuvastatin 5 mg 1 at bedtime.   Also any snacks need to be fruit or vegetables.  No cakes cookies ice cream chips in the in the house.  Once per week when you do go out on the weekend you may have a small serving of ice cream or 1 small bag of   Chips if you feel like you need this, however none to be kept at home.  Your cholesterol has increased and your blood sugar has increased slightly.  Changing eating habits.  Specifically no pork products sausage red meat.  Instead of frying you need to bake and  broil, stop mounrtain dew and only unsweetened tea and water  Eat eating chicken fish beings [place] healthy choices for protein.  You do have superficial vein distention in your legs which is causing them to be a bit dark.  The circulation as in the pulse is good.  Keeping your legs elevated will help any slight swelling.  As far as the darkness in the legs this is because of the veins being slightly weak.  Low strength compression hose could add some slight benefit however they are not significantly indicated medically at this time

## 2022-12-10 NOTE — Progress Notes (Unsigned)
   Gina Black     MRN: [id number]      DOB: 12-19-1955  Chief Complaint  Patient presents with   Follow-up    Follow up     HPI Gina Black is here for follow up and re-evaluation of chronic medical conditions, medication management and review of any available recent lab and radiology data.  Preventive health is updated, specifically  Cancer screening and Immunization.   Questions or concerns regarding consultations or procedures which the PT has had in the interim are  addressed. The PT denies any adverse reactions to current medications since the last visit.  There are no new concerns.  There are no specific complaints   ROS Denies recent fever or chills. Denies sinus pressure, nasal congestion, ear pain or sore throat. Denies chest congestion, productive cough or wheezing. Denies chest pains, palpitations and leg swelling Denies abdominal pain, nausea, vomiting,diarrhea or constipation.   Denies dysuria, frequency, hesitancy or incontinence. Denies joint pain, swelling and limitation in mobility. Denies headaches, seizures, numbness, or tingling. Denies depression, anxiety or insomnia. Denies skin break down or rash.   PE  BP (!) 144/74 (BP Location: Left Arm, Patient Position: Sitting, Cuff Size: Large)   Pulse 70   Ht 5\' 7"  (1.702 m)   Wt 176 lb 1.9 oz (79.9 kg)   SpO2 97%   BMI 27.58 kg/m   Patient alert and oriented and in no cardiopulmonary distress.  HEENT: No facial asymmetry, EOMI,     Neck supple .  Chest: Clear to auscultation bilaterally.  CVS: S1, S2 no murmurs, no S3.Regular rate.  ABD: Soft non tender.   Ext: No edema  MS: Adequate ROM spine, shoulders, hips and knees.  Skin: Intact, no ulcerations or rash noted.  Psych: Good eye contact, normal affect. Memory intact not anxious or depressed appearing.  CNS: CN 2-12 intact, power,  normal throughout.no focal deficits noted.   Assessment & Plan  No problem-specific Assessment &  Plan notes found for this encounter.

## 2022-12-12 ENCOUNTER — Encounter: Payer: Self-pay | Admitting: Family Medicine

## 2022-12-12 DIAGNOSIS — Z23 Encounter for immunization: Secondary | ICD-10-CM | POA: Insufficient documentation

## 2022-12-12 NOTE — Assessment & Plan Note (Signed)
Hyperlipidemia:Low fat diet discussed and encouraged.   Lipid Panel  Lab Results  Component Value Date   CHOL 210 (H) 12/08/2022   HDL 67 12/08/2022   LDLCALC 133 (H) 12/08/2022   TRIG 56 12/08/2022   CHOLHDL 3.1 12/08/2022     Start crestor 5 mg daily

## 2022-12-12 NOTE — Assessment & Plan Note (Signed)
After obtaining informed consent, the vaccine is  administered , with no adverse effect noted at the time of administration.  

## 2022-12-12 NOTE — Assessment & Plan Note (Signed)
DASH diet and commitment to daily physical activity for a minimum of 30 minutes discussed and encouraged, as a part of hypertension management. The importance of attaining a healthy weight is also discussed.     12/10/2022   10:18 AM 12/10/2022    9:49 AM 12/10/2022    9:48 AM 10/14/2022   10:14 AM 07/30/2022   11:39 AM 07/30/2022   10:59 AM 05/12/2022   11:14 AM  BP/Weight  Systolic BP 140 144 149 152 147 147 144  Diastolic BP 82 74 76 78 72 74 80  Wt. (Lbs)   176.12 176.12  181.08   BMI   27.58 kg/m2 27.58 kg/m2  28.36 kg/m2      Add clonidine 0.1 mg at bedtime, still not at goal but improving

## 2022-12-12 NOTE — Assessment & Plan Note (Signed)
Patient educated about the importance of limiting  Carbohydrate intake , the need to commit to daily physical activity for a minimum of 30 minutes , and to commit weight loss. The fact that changes in all these areas will reduce or eliminate all together the development of diabetes is stressed.      Latest Ref Rng & Units 12/08/2022    9:46 AM 07/26/2022    9:13 AM 01/25/2022    8:59 AM 04/11/2019    3:39 PM 03/18/2018   11:06 AM  Diabetic Labs  HbA1c 4.8 - 5.6 % 5.9  5.7  5.8     Chol 100 - 199 mg/dL 528  413  244  010  272   HDL >39 mg/dL 67  64  58  70  70   Calc LDL 0 - 99 mg/dL 536  644  034  742  595   Triglycerides 0 - 149 mg/dL 56  42  47  47  49   Creatinine 0.57 - 1.00 mg/dL 6.38  7.56  4.33  2.95  1.03       12/10/2022   10:18 AM 12/10/2022    9:49 AM 12/10/2022    9:48 AM 10/14/2022   10:14 AM 07/30/2022   11:39 AM 07/30/2022   10:59 AM 05/12/2022   11:14 AM  BP/Weight  Systolic BP 140 144 149 152 147 147 144  Diastolic BP 82 74 76 78 72 74 80  Wt. (Lbs)   176.12 176.12  181.08   BMI   27.58 kg/m2 27.58 kg/m2  28.36 kg/m2        No data to display          Deteriorated

## 2022-12-12 NOTE — Assessment & Plan Note (Signed)
Vascular evaluation arterial flow is adequate , has superficial varicose veins, non compliant wih compression hose that she has . Daughter expressed concern about discoloration of legs , will offer forml referral to vascular , encouraged wearing of hose in the interim,

## 2023-01-04 ENCOUNTER — Other Ambulatory Visit: Payer: Self-pay | Admitting: Family Medicine

## 2023-01-25 ENCOUNTER — Encounter (INDEPENDENT_AMBULATORY_CARE_PROVIDER_SITE_OTHER): Payer: Self-pay | Admitting: *Deleted

## 2023-02-11 ENCOUNTER — Other Ambulatory Visit: Payer: Self-pay | Admitting: Family Medicine

## 2023-02-25 ENCOUNTER — Ambulatory Visit (INDEPENDENT_AMBULATORY_CARE_PROVIDER_SITE_OTHER): Payer: No Typology Code available for payment source | Admitting: Family Medicine

## 2023-02-25 ENCOUNTER — Encounter: Payer: Self-pay | Admitting: Family Medicine

## 2023-02-25 VITALS — BP 127/69 | HR 86 | Ht 67.0 in | Wt 174.0 lb

## 2023-02-25 DIAGNOSIS — Z780 Asymptomatic menopausal state: Secondary | ICD-10-CM

## 2023-02-25 DIAGNOSIS — E785 Hyperlipidemia, unspecified: Secondary | ICD-10-CM

## 2023-02-25 DIAGNOSIS — F29 Unspecified psychosis not due to a substance or known physiological condition: Secondary | ICD-10-CM | POA: Diagnosis not present

## 2023-02-25 DIAGNOSIS — E663 Overweight: Secondary | ICD-10-CM | POA: Diagnosis not present

## 2023-02-25 DIAGNOSIS — Z1231 Encounter for screening mammogram for malignant neoplasm of breast: Secondary | ICD-10-CM | POA: Diagnosis not present

## 2023-02-25 DIAGNOSIS — I10 Essential (primary) hypertension: Secondary | ICD-10-CM

## 2023-02-25 DIAGNOSIS — R7302 Impaired glucose tolerance (oral): Secondary | ICD-10-CM | POA: Diagnosis not present

## 2023-02-25 MED ORDER — POTASSIUM CHLORIDE CRYS ER 20 MEQ PO TBCR: EXTENDED_RELEASE_TABLET | ORAL | 5 refills | Status: DC

## 2023-02-25 NOTE — Patient Instructions (Addendum)
F/U end May, call if you need me sooner  Chem 7 and EGFr today  Fasting lipid, cmp and EGFr and hBa1C 3 to 5 days before April appt  Excellent BP Please continue to eat low sodium, low fat, natural food a lot of vegetables and fruit fresh or frozen   If BP is still as good as it is , I will stop the amlodipine 2.5 mg tablet  Pls schedule mammogram and dexa at checkout for April if possible  Thanks for choosing [facility], we consider it a privelige to serve you.

## 2023-02-26 LAB — BMP8+EGFR
BUN/Creatinine Ratio: 20 (ref 12–28)
BUN: 22 mg/dL (ref 8–27)
CO2: 27 mmol/L (ref 20–29)
Calcium: 10 mg/dL (ref 8.7–10.3)
Chloride: 99 mmol/L (ref 96–106)
Creatinine, Ser: 1.12 mg/dL — ABNORMAL HIGH (ref 0.57–1.00)
Glucose: 104 mg/dL — ABNORMAL HIGH (ref 70–99)
Potassium: 3.4 mmol/L — ABNORMAL LOW (ref 3.5–5.2)
Sodium: 142 mmol/L (ref 134–144)
eGFR: 54 mL/min/{1.73_m2} — ABNORMAL LOW (ref >59)

## 2023-02-28 ENCOUNTER — Encounter: Payer: Self-pay | Admitting: Family Medicine

## 2023-03-01 DIAGNOSIS — Z780 Asymptomatic menopausal state: Secondary | ICD-10-CM | POA: Insufficient documentation

## 2023-03-01 NOTE — Assessment & Plan Note (Signed)
Controlled, no change in medication DASH diet and commitment to daily physical activity for a minimum of 30 minutes discussed and encouraged, as a part of hypertension management. The importance of attaining a healthy weight is also discussed.     02/25/2023    9:44 AM 12/10/2022   10:18 AM 12/10/2022    9:49 AM 12/10/2022    9:48 AM 10/14/2022   10:14 AM 07/30/2022   11:39 AM 07/30/2022   10:59 AM  BP/Weight  Systolic BP 127 140 144 149 152 147 147  Diastolic BP 69 82 74 76 78 72 74  Wt. (Lbs) 174   176.12 176.12  181.08  BMI 27.25 kg/m2   27.58 kg/m2 27.58 kg/m2  28.36 kg/m2     Will lower anlodipine dose if control is as good at next visit

## 2023-03-01 NOTE — Progress Notes (Signed)
Gina Black     MRN: [id number]      DOB: 1955/06/11  Chief Complaint  Patient presents with   Follow-up    Follow up    HPI Gina Black is here for follow up and re-evaluation of chronic medical conditions, medication management and review of any available recent lab and radiology data.  Preventive health is updated, specifically  Cancer screening and Immunization.   Questions or concerns regarding consultations or procedures which the PT has had in the interim are  addressed.Has had both dental work and procedure on right foot since last visit and is doing well, still has more dental work to be done The PT denies any adverse reactions to current medications since the last visit.  There are no new concerns.  There are no specific complaints   ROS Denies recent fever or chills. Denies sinus pressure, nasal congestion, ear pain or sore throat. Denies chest congestion, productive cough or wheezing. Denies chest pains, palpitations and leg swelling Denies abdominal pain, nausea, vomiting,diarrhea or constipation.   Denies dysuria, frequency, hesitancy or incontinence. Denies joint pain, swelling and limitation in mobility. Denies headaches, seizures, numbness, or tingling. Denies depression, anxiety or insomnia. Denies skin break down or rash.   PE  BP 127/69 (BP Location: Right Arm, Patient Position: Sitting, Cuff Size: Normal)   Pulse 86   Ht 5\' 7"  (1.702 m)   Wt 174 lb (78.9 kg)   SpO2 95%   BMI 27.25 kg/m   Patient alert and oriented and in no cardiopulmonary distress.  HEENT: No facial asymmetry, EOMI,     Neck supple .  Chest: Clear to auscultation bilaterally.  CVS: S1, S2 no murmurs, no S3.Regular rate.  ABD: Soft non tender.   Ext: No edema  MS: Adequate ROM spine, shoulders, hips and knees.  Skin: Intact, no ulcerations or rash noted.  Psych: Good eye contact, normal affect. Memory intact not anxious or depressed appearing.  CNS: CN 2-12  intact, power,  normal throughout.no focal deficits noted.   Assessment & Plan  Essential hypertension Controlled, no change in medication DASH diet and commitment to daily physical activity for a minimum of 30 minutes discussed and encouraged, as a part of hypertension management. The importance of attaining a healthy weight is also discussed.     02/25/2023    9:44 AM 12/10/2022   10:18 AM 12/10/2022    9:49 AM 12/10/2022    9:48 AM 10/14/2022   10:14 AM 07/30/2022   11:39 AM 07/30/2022   10:59 AM  BP/Weight  Systolic BP 127 140 144 149 152 147 147  Diastolic BP 69 82 74 76 78 72 74  Wt. (Lbs) 174   176.12 176.12  181.08  BMI 27.25 kg/m2   27.58 kg/m2 27.58 kg/m2  28.36 kg/m2     Will lower anlodipine dose if control is as good at next visit  Dyslipidemia Hyperlipidemia:Low fat diet discussed and encouraged.   Lipid Panel  Lab Results  Component Value Date   CHOL 210 (H) 12/08/2022   HDL 67 12/08/2022   LDLCALC 133 (H) 12/08/2022   TRIG 56 12/08/2022   CHOLHDL 3.1 12/08/2022     Uncontrolled  Updated lab needed at/ before next visit.   Overweight (BMI 25.0-29.9)  Patient re-educated about  the importance of commitment to a  minimum of 150 minutes of exercise per week as able.  The importance of healthy food choices with portion control discussed, as well as eating regularly and  within a 12 hour window most days. The need to choose "clean , green" food 50 to 75% of the time is discussed, as well as to make water the primary drink and set a goal of 64 ounces water daily.       02/25/2023    9:44 AM 12/10/2022    9:48 AM 10/14/2022   10:14 AM  Weight /BMI  Weight 174 lb 176 lb 1.9 oz 176 lb 1.9 oz  Height 5\' 7"  (1.702 m) 5\' 7"  (1.702 m) 5\' 7"  (1.702 m)  BMI 27.25 kg/m2 27.58 kg/m2 27.58 kg/m2      Psychotic disorder with delusions Stable , off medication for years, managed by her daughter who lives out of [place] but does have a caregiver locally  IGT  (impaired glucose tolerance) Patient educated about the importance of limiting  Carbohydrate intake , the need to commit to daily physical activity for a minimum of 30 minutes , and to commit weight loss. The fact that changes in all these areas will reduce or eliminate all together the development of diabetes is stressed.  Updated lab needed at/ before next visit.      Latest Ref Rng & Units 02/25/2023   10:22 AM 12/08/2022    9:46 AM 07/26/2022    9:13 AM 01/25/2022    8:59 AM 04/11/2019    3:39 PM  Diabetic Labs  HbA1c 4.8 - 5.6 %  5.9  5.7  5.8    Chol 100 - 199 mg/dL  782  956  213  086   HDL >39 mg/dL  67  64  58  70   Calc LDL 0 - 99 mg/dL  578  469  629  528   Triglycerides 0 - 149 mg/dL  56  42  47  47   Creatinine 0.57 - 1.00 mg/dL 4.13  2.44  0.10  2.72  0.84       02/25/2023    9:44 AM 12/10/2022   10:18 AM 12/10/2022    9:49 AM 12/10/2022    9:48 AM 10/14/2022   10:14 AM 07/30/2022   11:39 AM 07/30/2022   10:59 AM  BP/Weight  Systolic BP 127 140 144 149 152 147 147  Diastolic BP 69 82 74 76 78 72 74  Wt. (Lbs) 174   176.12 176.12  181.08  BMI 27.25 kg/m2   27.58 kg/m2 27.58 kg/m2  28.36 kg/m2       No data to display

## 2023-03-01 NOTE — Assessment & Plan Note (Signed)
Patient educated about the importance of limiting  Carbohydrate intake , the need to commit to daily physical activity for a minimum of 30 minutes , and to commit weight loss. The fact that changes in all these areas will reduce or eliminate all together the development of diabetes is stressed.  Updated lab needed at/ before next visit.      Latest Ref Rng & Units 02/25/2023   10:22 AM 12/08/2022    9:46 AM 07/26/2022    9:13 AM 01/25/2022    8:59 AM 04/11/2019    3:39 PM  Diabetic Labs  HbA1c 4.8 - 5.6 %  5.9  5.7  5.8    Chol 100 - 199 mg/dL  213  086  578  469   HDL >39 mg/dL  67  64  58  70   Calc LDL 0 - 99 mg/dL  629  528  413  244   Triglycerides 0 - 149 mg/dL  56  42  47  47   Creatinine 0.57 - 1.00 mg/dL 0.10  2.72  5.36  6.44  0.84       02/25/2023    9:44 AM 12/10/2022   10:18 AM 12/10/2022    9:49 AM 12/10/2022    9:48 AM 10/14/2022   10:14 AM 07/30/2022   11:39 AM 07/30/2022   10:59 AM  BP/Weight  Systolic BP 127 140 144 149 152 147 147  Diastolic BP 69 82 74 76 78 72 74  Wt. (Lbs) 174   176.12 176.12  181.08  BMI 27.25 kg/m2   27.58 kg/m2 27.58 kg/m2  28.36 kg/m2       No data to display

## 2023-03-01 NOTE — Assessment & Plan Note (Signed)
  Patient re-educated about  the importance of commitment to a  minimum of 150 minutes of exercise per week as able.  The importance of healthy food choices with portion control discussed, as well as eating regularly and within a 12 hour window most days. The need to choose "clean , green" food 50 to 75% of the time is discussed, as well as to make water the primary drink and set a goal of 64 ounces water daily.       02/25/2023    9:44 AM 12/10/2022    9:48 AM 10/14/2022   10:14 AM  Weight /BMI  Weight 174 lb 176 lb 1.9 oz 176 lb 1.9 oz  Height 5\' 7"  (1.702 m) 5\' 7"  (1.702 m) 5\' 7"  (1.702 m)  BMI 27.25 kg/m2 27.58 kg/m2 27.58 kg/m2

## 2023-03-01 NOTE — Assessment & Plan Note (Signed)
Stable , off medication for years, managed by her daughter who lives out of [place] but does have a caregiver locally

## 2023-03-01 NOTE — Assessment & Plan Note (Signed)
Hyperlipidemia:Low fat diet discussed and encouraged.   Lipid Panel  Lab Results  Component Value Date   CHOL 210 (H) 12/08/2022   HDL 67 12/08/2022   LDLCALC 133 (H) 12/08/2022   TRIG 56 12/08/2022   CHOLHDL 3.1 12/08/2022     Uncontrolled  Updated lab needed at/ before next visit.

## 2023-03-02 NOTE — Telephone Encounter (Unsigned)
Copied from CRM [id number]. Topic: Clinical - Lab/Test Results >> Mar 02, 2023 11:42 AM Nada Libman H wrote: Reason for CRM: patient received a call from Nurse Katie to discuss lab results//Please call patient back @ [phone]

## 2023-03-15 ENCOUNTER — Other Ambulatory Visit: Payer: Self-pay

## 2023-03-15 MED ORDER — ROSUVASTATIN CALCIUM 5 MG PO TABS: 5.0000 mg | ORAL_TABLET | Freq: Every day | ORAL | 3 refills | Status: DC

## 2023-04-14 ENCOUNTER — Encounter (INDEPENDENT_AMBULATORY_CARE_PROVIDER_SITE_OTHER): Payer: Self-pay | Admitting: *Deleted

## 2023-04-18 ENCOUNTER — Other Ambulatory Visit: Payer: Self-pay | Admitting: Family Medicine

## 2023-05-10 ENCOUNTER — Other Ambulatory Visit: Payer: Self-pay | Admitting: Family Medicine

## 2023-05-12 ENCOUNTER — Telehealth: Payer: Self-pay | Admitting: Family Medicine

## 2023-05-12 NOTE — Telephone Encounter (Signed)
 Meed refilled for crestor 5 mg tablet

## 2023-06-01 ENCOUNTER — Ambulatory Visit (HOSPITAL_COMMUNITY): Payer: No Typology Code available for payment source

## 2023-06-01 ENCOUNTER — Other Ambulatory Visit (HOSPITAL_COMMUNITY): Payer: No Typology Code available for payment source

## 2023-06-15 ENCOUNTER — Other Ambulatory Visit: Payer: Self-pay | Admitting: Family Medicine

## 2023-07-05 ENCOUNTER — Ambulatory Visit (INDEPENDENT_AMBULATORY_CARE_PROVIDER_SITE_OTHER): Payer: No Typology Code available for payment source | Admitting: Family Medicine

## 2023-07-05 ENCOUNTER — Encounter: Payer: Self-pay | Admitting: Family Medicine

## 2023-07-05 VITALS — BP 138/71 | HR 79 | Resp 16 | Ht 67.0 in | Wt 169.1 lb

## 2023-07-05 DIAGNOSIS — E785 Hyperlipidemia, unspecified: Secondary | ICD-10-CM | POA: Diagnosis not present

## 2023-07-05 DIAGNOSIS — I10 Essential (primary) hypertension: Secondary | ICD-10-CM | POA: Diagnosis not present

## 2023-07-05 DIAGNOSIS — R7302 Impaired glucose tolerance (oral): Secondary | ICD-10-CM | POA: Diagnosis not present

## 2023-07-05 DIAGNOSIS — E663 Overweight: Secondary | ICD-10-CM

## 2023-07-05 NOTE — Assessment & Plan Note (Signed)
 Improved, which is great  Patient re-educated about  the importance of commitment to a  minimum of 150 minutes of exercise per week as able.  The importance of healthy food choices with portion control discussed, as well as eating regularly and within a 12 hour window most days. The need to choose "clean , green" food 50 to 75% of the time is discussed, as well as to make water the primary drink and set a goal of 64 ounces water daily.       07/05/2023   10:07 AM 02/25/2023    9:44 AM 12/10/2022    9:48 AM  Weight /BMI  Weight 169 lb 1.9 oz 174 lb 176 lb 1.9 oz  Height 5\' 7"  (1.702 m) 5\' 7"  (1.702 m) 5\' 7"  (1.702 m)  BMI 26.49 kg/m2 27.25 kg/m2 27.58 kg/m2

## 2023-07-05 NOTE — Assessment & Plan Note (Signed)
 Patient educated about the importance of limiting  Carbohydrate intake , the need to commit to daily physical activity for a minimum of 30 minutes , and to commit weight loss. The fact that changes in all these areas will reduce or eliminate all together the development of diabetes is stressed.      Latest Ref Rng & Units 02/25/2023   10:22 AM 12/08/2022    9:46 AM 07/26/2022    9:13 AM 01/25/2022    8:59 AM 04/11/2019    3:39 PM  Diabetic Labs  HbA1c 4.8 - 5.6 %  5.9  5.7  5.8    Chol 100 - 199 mg/dL  161  096  045  409   HDL >39 mg/dL  67  64  58  70   Calc LDL 0 - 99 mg/dL  811  914  782  956   Triglycerides 0 - 149 mg/dL  56  42  47  47   Creatinine 0.57 - 1.00 mg/dL 2.13  0.86  5.78  4.69  0.84       07/05/2023   10:07 AM 02/25/2023    9:44 AM 12/10/2022   10:18 AM 12/10/2022    9:49 AM 12/10/2022    9:48 AM 10/14/2022   10:14 AM 07/30/2022   11:39 AM  BP/Weight  Systolic BP 138 127 140 144 149 152 147  Diastolic BP 71 69 82 74 76 78 72  Wt. (Lbs) 169.12 174   176.12 176.12   BMI 26.49 kg/m2 27.25 kg/m2   27.58 kg/m2 27.58 kg/m2        No data to display          Updated lab needed at/ before next visit.

## 2023-07-05 NOTE — Assessment & Plan Note (Signed)
 Controlled, no change in medication DASH diet and commitment to daily physical activity for a minimum of 30 minutes discussed and encouraged, as a part of hypertension management. The importance of attaining a healthy weight is also discussed.     07/05/2023   10:07 AM 02/25/2023    9:44 AM 12/10/2022   10:18 AM 12/10/2022    9:49 AM 12/10/2022    9:48 AM 10/14/2022   10:14 AM 07/30/2022   11:39 AM  BP/Weight  Systolic BP 138 127 140 144 149 152 147  Diastolic BP 71 69 82 74 76 78 72  Wt. (Lbs) 169.12 174   176.12 176.12   BMI 26.49 kg/m2 27.25 kg/m2   27.58 kg/m2 27.58 kg/m2

## 2023-07-05 NOTE — Progress Notes (Signed)
 Gina Black     MRN: [id number]      DOB: Jan 20, 1956  Chief Complaint  Patient presents with   Hypertension    4 month follow up. Would also like to follow up on kidney function     HPI Gina Black is here for follow up and re-evaluation of chronic medical conditions, medication management and review of any available recent lab and radiology data.  Preventive health is updated, specifically  Cancer screening and Immunization.   Getting dental work done slowly. The PT denies any adverse reactions to current medications since the last visit.  There are no new concerns.  There are no specific complaints   ROS Denies recent fever or chills. Denies sinus pressure, nasal congestion, ear pain or sore throat. Denies chest congestion, productive cough or wheezing. Denies chest pains, palpitations and leg swelling Denies abdominal pain, nausea, vomiting,diarrhea or constipation.   Denies dysuria, frequency, hesitancy or incontinence. Denies joint pain, swelling and limitation in mobility. Denies headaches, seizures, numbness, or tingling. Denies depression, anxiety or insomnia. Denies skin break down or rash.   PE  BP 138/71   Pulse 79   Resp 16   Ht 5\' 7"  (1.702 m)   Wt 169 lb 1.9 oz (76.7 kg)   SpO2 97%   BMI 26.49 kg/m   Patient alert and oriented and in no cardiopulmonary distress.  HEENT: No facial asymmetry, EOMI,     Neck supple .  Chest: Clear to auscultation bilaterally.  CVS: S1, S2 no murmurs, no S3.Regular rate.  ABD: Soft non tender.   Ext: No edema  MS: Adequate ROM spine, shoulders, hips and knees.  Skin: Intact, no ulcerations or rash noted.  Psych: Good eye contact, normal affect. Memory intact not anxious or depressed appearing.  CNS: CN 2-12 intact, power,  normal throughout.no focal deficits noted.   Assessment & Plan  Essential hypertension Controlled, no change in medication DASH diet and commitment to daily physical activity for  a minimum of 30 minutes discussed and encouraged, as a part of hypertension management. The importance of attaining a healthy weight is also discussed.     07/05/2023   10:07 AM 02/25/2023    9:44 AM 12/10/2022   10:18 AM 12/10/2022    9:49 AM 12/10/2022    9:48 AM 10/14/2022   10:14 AM 07/30/2022   11:39 AM  BP/Weight  Systolic BP 138 127 140 144 149 152 147  Diastolic BP 71 69 82 74 76 78 72  Wt. (Lbs) 169.12 174   176.12 176.12   BMI 26.49 kg/m2 27.25 kg/m2   27.58 kg/m2 27.58 kg/m2        Dyslipidemia Hyperlipidemia:Low fat diet discussed and encouraged.   Lipid Panel  Lab Results  Component Value Date   CHOL 210 (H) 12/08/2022   HDL 67 12/08/2022   LDLCALC 133 (H) 12/08/2022   TRIG 56 12/08/2022   CHOLHDL 3.1 12/08/2022     Updated lab needed at/ before next visit.   IGT (impaired glucose tolerance) Patient educated about the importance of limiting  Carbohydrate intake , the need to commit to daily physical activity for a minimum of 30 minutes , and to commit weight loss. The fact that changes in all these areas will reduce or eliminate all together the development of diabetes is stressed.      Latest Ref Rng & Units 02/25/2023   10:22 AM 12/08/2022    9:46 AM 07/26/2022    9:13 AM 01/25/2022  8:59 AM 04/11/2019    3:39 PM  Diabetic Labs  HbA1c 4.8 - 5.6 %  5.9  5.7  5.8    Chol 100 - 199 mg/dL  604  540  981  191   HDL >39 mg/dL  67  64  58  70   Calc LDL 0 - 99 mg/dL  478  295  621  308   Triglycerides 0 - 149 mg/dL  56  42  47  47   Creatinine 0.57 - 1.00 mg/dL 6.57  8.46  9.62  9.52  0.84       07/05/2023   10:07 AM 02/25/2023    9:44 AM 12/10/2022   10:18 AM 12/10/2022    9:49 AM 12/10/2022    9:48 AM 10/14/2022   10:14 AM 07/30/2022   11:39 AM  BP/Weight  Systolic BP 138 127 140 144 149 152 147  Diastolic BP 71 69 82 74 76 78 72  Wt. (Lbs) 169.12 174   176.12 176.12   BMI 26.49 kg/m2 27.25 kg/m2   27.58 kg/m2 27.58 kg/m2        No data to display           Updated lab needed at/ before next visit.   Overweight (BMI 25.0-29.9) Improved, which is great  Patient re-educated about  the importance of commitment to a  minimum of 150 minutes of exercise per week as able.  The importance of healthy food choices with portion control discussed, as well as eating regularly and within a 12 hour window most days. The need to choose "clean , green" food 50 to 75% of the time is discussed, as well as to make water the primary drink and set a goal of 64 ounces water daily.       07/05/2023   10:07 AM 02/25/2023    9:44 AM 12/10/2022    9:48 AM  Weight /BMI  Weight 169 lb 1.9 oz 174 lb 176 lb 1.9 oz  Height 5\' 7"  (1.702 m) 5\' 7"  (1.702 m) 5\' 7"  (1.702 m)  BMI 26.49 kg/m2 27.25 kg/m2 27.58 kg/m2

## 2023-07-05 NOTE — Assessment & Plan Note (Signed)
 Hyperlipidemia:Low fat diet discussed and encouraged.   Lipid Panel  Lab Results  Component Value Date   CHOL 210 (H) 12/08/2022   HDL 67 12/08/2022   LDLCALC 133 (H) 12/08/2022   TRIG 56 12/08/2022   CHOLHDL 3.1 12/08/2022     Updated lab needed at/ before next visit.

## 2023-07-05 NOTE — Patient Instructions (Addendum)
 Annual exam WITH  MD , MID SEPTEMBER, CALL IF YOU NEED ME SOONER  PLEASE SCHEDULE MAMMOGRAM AND DEXA AT CHECKOUT  LABS TODAY LIPID, CMP AND HBA1C ORDERED 02/2023  All the best with dental work  Thank you for bringing medication, please always bring  it!  Think about what you will eat, plan ahead. Choose " clean, green, fresh or frozen" over canned, processed or packaged foods which are more sugary, salty and fatty. 70 to 75% of food eaten should be vegetables and fruit. Three meals at set times with snacks allowed between meals, but they must be fruit or vegetables. Aim to eat over a 12 hour period , example 7 am to 7 pm, and STOP after  your last meal of the day. Drink water,generally about 64 ounces per day, no other drink is as healthy. Fruit juice is best enjoyed in a healthy way, by EATING the fruit. Thanks for choosing [facility], we consider it a privelige to serve you.

## 2023-07-06 ENCOUNTER — Ambulatory Visit: Payer: Self-pay | Admitting: Family Medicine

## 2023-07-06 LAB — HEMOGLOBIN A1C
Est. average glucose Bld gHb Est-mCnc: 120 mg/dL
Hgb A1c MFr Bld: 5.8 % — ABNORMAL HIGH (ref 4.8–5.6)

## 2023-07-06 LAB — CMP14+EGFR
ALT: 16 IU/L (ref 0–32)
AST: 26 IU/L (ref 0–40)
Albumin: 4.4 g/dL (ref 3.9–4.9)
Alkaline Phosphatase: 53 IU/L (ref 44–121)
BUN/Creatinine Ratio: 25 (ref 12–28)
BUN: 25 mg/dL (ref 8–27)
Bilirubin Total: 0.6 mg/dL (ref 0.0–1.2)
CO2: 25 mmol/L (ref 20–29)
Calcium: 9.9 mg/dL (ref 8.7–10.3)
Chloride: 100 mmol/L (ref 96–106)
Creatinine, Ser: 1.01 mg/dL — ABNORMAL HIGH (ref 0.57–1.00)
Globulin, Total: 3.3 g/dL (ref 1.5–4.5)
Glucose: 90 mg/dL (ref 70–99)
Potassium: 3.2 mmol/L — ABNORMAL LOW (ref 3.5–5.2)
Sodium: 142 mmol/L (ref 134–144)
Total Protein: 7.7 g/dL (ref 6.0–8.5)
eGFR: 61 mL/min/{1.73_m2} (ref >59)

## 2023-07-06 LAB — LIPID PANEL
Chol/HDL Ratio: 2.5 ratio (ref 0.0–4.4)
Cholesterol, Total: 163 mg/dL (ref 100–199)
HDL: 64 mg/dL (ref >39)
LDL Chol Calc (NIH): 91 mg/dL (ref 0–99)
Triglycerides: 36 mg/dL (ref 0–149)
VLDL Cholesterol Cal: 8 mg/dL (ref 5–40)

## 2023-07-06 MED ORDER — POTASSIUM CHLORIDE CRYS ER 20 MEQ PO TBCR: 20.0000 meq | EXTENDED_RELEASE_TABLET | Freq: Two times a day (BID) | ORAL | 4 refills | Status: AC

## 2023-07-18 ENCOUNTER — Ambulatory Visit (HOSPITAL_COMMUNITY)
Admission: RE | Admit: 2023-07-18 | Discharge: 2023-07-18 | Disposition: A | Discharge status: Home / Self Care | Source: Ambulatory Visit | Attending: Family Medicine | Admitting: Family Medicine

## 2023-07-18 DIAGNOSIS — M8589 Other specified disorders of bone density and structure, multiple sites: Secondary | ICD-10-CM | POA: Diagnosis not present

## 2023-07-18 DIAGNOSIS — Z780 Asymptomatic menopausal state: Secondary | ICD-10-CM | POA: Insufficient documentation

## 2023-07-18 DIAGNOSIS — Z1231 Encounter for screening mammogram for malignant neoplasm of breast: Secondary | ICD-10-CM

## 2023-08-18 ENCOUNTER — Other Ambulatory Visit: Payer: Self-pay | Admitting: Family Medicine

## 2023-10-21 ENCOUNTER — Other Ambulatory Visit: Payer: Self-pay | Admitting: Family Medicine

## 2023-11-08 ENCOUNTER — Encounter: Admitting: Family Medicine

## 2023-12-23 ENCOUNTER — Encounter: Payer: Self-pay | Admitting: Family Medicine

## 2023-12-29 ENCOUNTER — Other Ambulatory Visit: Payer: Self-pay | Admitting: Family Medicine

## 2024-01-27 ENCOUNTER — Encounter: Admitting: Family Medicine

## 2024-03-05 ENCOUNTER — Other Ambulatory Visit: Payer: Self-pay | Admitting: Family Medicine

## 2024-03-28 ENCOUNTER — Encounter: Payer: Self-pay | Admitting: Family Medicine
